# Patient Record
Sex: Female | Born: 1935 | Race: Black or African American | Hispanic: No | Smoking: Former smoker
Health system: Southern US, Community
[De-identification: ages and names within clinical notes are randomized; demographics above are authoritative.]

## PROBLEM LIST (undated history)

## (undated) DIAGNOSIS — Z8719 Personal history of other diseases of the digestive system: Secondary | ICD-10-CM

## (undated) DIAGNOSIS — M1990 Unspecified osteoarthritis, unspecified site: Secondary | ICD-10-CM

## (undated) DIAGNOSIS — T7840XA Allergy, unspecified, initial encounter: Secondary | ICD-10-CM

## (undated) DIAGNOSIS — E785 Hyperlipidemia, unspecified: Secondary | ICD-10-CM

## (undated) DIAGNOSIS — I10 Essential (primary) hypertension: Secondary | ICD-10-CM

## (undated) DIAGNOSIS — J45909 Unspecified asthma, uncomplicated: Secondary | ICD-10-CM

## (undated) DIAGNOSIS — E079 Disorder of thyroid, unspecified: Secondary | ICD-10-CM

## (undated) DIAGNOSIS — Z8711 Personal history of peptic ulcer disease: Secondary | ICD-10-CM

## (undated) DIAGNOSIS — H409 Unspecified glaucoma: Secondary | ICD-10-CM

## (undated) HISTORY — DX: Personal history of peptic ulcer disease: Z87.11

## (undated) HISTORY — PX: KNEE SURGERY: SHX244

## (undated) HISTORY — PX: ABDOMINAL HYSTERECTOMY: SHX81

## (undated) HISTORY — DX: Personal history of other diseases of the digestive system: Z87.19

## (undated) HISTORY — PX: INCONTINENCE SURGERY: SHX676

## (undated) HISTORY — DX: Essential (primary) hypertension: I10

## (undated) HISTORY — PX: FOOT SURGERY: SHX648

## (undated) HISTORY — DX: Unspecified glaucoma: H40.9

## (undated) HISTORY — DX: Unspecified osteoarthritis, unspecified site: M19.90

## (undated) HISTORY — DX: Allergy, unspecified, initial encounter: T78.40XA

## (undated) HISTORY — PX: HIP SURGERY: SHX245

## (undated) HISTORY — PX: SPINE SURGERY: SHX786

## (undated) HISTORY — DX: Hyperlipidemia, unspecified: E78.5

## (undated) HISTORY — DX: Disorder of thyroid, unspecified: E07.9

## (undated) HISTORY — DX: Unspecified asthma, uncomplicated: J45.909

## (undated) HISTORY — PX: FEMUR FRACTURE SURGERY: SHX633

## (undated) HISTORY — PX: NECK SURGERY: SHX720

---

## 1997-07-07 ENCOUNTER — Encounter: Admission: RE | Admit: 1997-07-07 | Discharge: 1997-10-05 | Payer: Self-pay | Admitting: Neurological Surgery

## 1997-11-07 ENCOUNTER — Other Ambulatory Visit: Admission: RE | Admit: 1997-11-07 | Discharge: 1997-11-07 | Payer: Self-pay | Admitting: Family Medicine

## 1998-11-25 ENCOUNTER — Encounter: Payer: Self-pay | Admitting: Orthopedic Surgery

## 1998-11-25 ENCOUNTER — Encounter: Admission: RE | Admit: 1998-11-25 | Discharge: 1998-11-25 | Payer: Self-pay | Admitting: Orthopedic Surgery

## 1999-05-07 ENCOUNTER — Encounter: Admission: RE | Admit: 1999-05-07 | Discharge: 1999-05-07 | Payer: Self-pay | Admitting: Rheumatology

## 1999-05-07 ENCOUNTER — Encounter: Payer: Self-pay | Admitting: Rheumatology

## 2000-08-09 ENCOUNTER — Encounter: Payer: Self-pay | Admitting: Rheumatology

## 2000-08-09 ENCOUNTER — Encounter: Admission: RE | Admit: 2000-08-09 | Discharge: 2000-08-09 | Payer: Self-pay | Admitting: Rheumatology

## 2000-08-30 ENCOUNTER — Encounter: Payer: Self-pay | Admitting: Neurological Surgery

## 2000-08-30 ENCOUNTER — Encounter: Admission: RE | Admit: 2000-08-30 | Discharge: 2000-08-30 | Payer: Self-pay | Admitting: Neurological Surgery

## 2000-09-19 ENCOUNTER — Encounter: Admission: RE | Admit: 2000-09-19 | Discharge: 2000-09-19 | Payer: Self-pay | Admitting: Neurological Surgery

## 2000-09-19 ENCOUNTER — Encounter: Payer: Self-pay | Admitting: Neurological Surgery

## 2000-11-10 ENCOUNTER — Other Ambulatory Visit: Admission: RE | Admit: 2000-11-10 | Discharge: 2000-11-10 | Payer: Self-pay | Admitting: Obstetrics and Gynecology

## 2001-08-30 ENCOUNTER — Ambulatory Visit (HOSPITAL_COMMUNITY): Admission: RE | Admit: 2001-08-30 | Discharge: 2001-08-30 | Payer: Self-pay | Admitting: Internal Medicine

## 2001-08-30 ENCOUNTER — Encounter: Payer: Self-pay | Admitting: Internal Medicine

## 2001-09-04 ENCOUNTER — Encounter: Payer: Self-pay | Admitting: Internal Medicine

## 2001-09-04 ENCOUNTER — Encounter: Admission: RE | Admit: 2001-09-04 | Discharge: 2001-09-04 | Payer: Self-pay | Admitting: Internal Medicine

## 2002-01-25 ENCOUNTER — Encounter: Admission: RE | Admit: 2002-01-25 | Discharge: 2002-01-25 | Payer: Self-pay | Admitting: Orthopedic Surgery

## 2002-01-25 ENCOUNTER — Encounter: Payer: Self-pay | Admitting: Orthopedic Surgery

## 2002-02-15 ENCOUNTER — Encounter: Admission: RE | Admit: 2002-02-15 | Discharge: 2002-03-14 | Payer: Self-pay | Admitting: Neurological Surgery

## 2002-03-21 ENCOUNTER — Encounter: Admission: RE | Admit: 2002-03-21 | Discharge: 2002-03-21 | Payer: Self-pay | Admitting: Internal Medicine

## 2002-03-21 ENCOUNTER — Encounter: Payer: Self-pay | Admitting: Internal Medicine

## 2002-04-01 ENCOUNTER — Encounter (HOSPITAL_COMMUNITY): Admission: RE | Admit: 2002-04-01 | Discharge: 2002-06-30 | Payer: Self-pay | Admitting: Internal Medicine

## 2002-04-02 ENCOUNTER — Encounter: Payer: Self-pay | Admitting: Internal Medicine

## 2002-05-10 ENCOUNTER — Encounter: Payer: Self-pay | Admitting: Internal Medicine

## 2002-11-20 ENCOUNTER — Encounter: Admission: RE | Admit: 2002-11-20 | Discharge: 2002-11-20 | Payer: Self-pay | Admitting: Orthopedic Surgery

## 2003-03-11 ENCOUNTER — Encounter: Admission: RE | Admit: 2003-03-11 | Discharge: 2003-03-11 | Payer: Self-pay | Admitting: Internal Medicine

## 2003-04-08 ENCOUNTER — Encounter: Admission: RE | Admit: 2003-04-08 | Discharge: 2003-04-08 | Payer: Self-pay | Admitting: Neurological Surgery

## 2003-04-28 ENCOUNTER — Encounter: Admission: RE | Admit: 2003-04-28 | Discharge: 2003-07-27 | Payer: Self-pay | Admitting: Internal Medicine

## 2003-08-03 ENCOUNTER — Ambulatory Visit (HOSPITAL_COMMUNITY): Admission: RE | Admit: 2003-08-03 | Discharge: 2003-08-03 | Payer: Self-pay | Admitting: Neurological Surgery

## 2003-08-21 ENCOUNTER — Encounter: Admission: RE | Admit: 2003-08-21 | Discharge: 2003-08-21 | Payer: Self-pay | Admitting: Neurological Surgery

## 2003-09-18 ENCOUNTER — Encounter: Admission: RE | Admit: 2003-09-18 | Discharge: 2003-09-18 | Payer: Self-pay | Admitting: Internal Medicine

## 2004-01-21 ENCOUNTER — Ambulatory Visit (HOSPITAL_BASED_OUTPATIENT_CLINIC_OR_DEPARTMENT_OTHER): Admission: RE | Admit: 2004-01-21 | Discharge: 2004-01-21 | Payer: Self-pay | Admitting: Orthopedic Surgery

## 2004-01-21 ENCOUNTER — Ambulatory Visit (HOSPITAL_COMMUNITY): Admission: RE | Admit: 2004-01-21 | Discharge: 2004-01-21 | Payer: Self-pay | Admitting: Orthopedic Surgery

## 2004-05-25 ENCOUNTER — Encounter: Admission: RE | Admit: 2004-05-25 | Discharge: 2004-05-25 | Payer: Self-pay | Admitting: Internal Medicine

## 2004-09-03 ENCOUNTER — Inpatient Hospital Stay (HOSPITAL_COMMUNITY)
Admission: RE | Admit: 2004-09-03 | Discharge: 2004-09-14 | Payer: Self-pay | Admitting: Physical Medicine & Rehabilitation

## 2004-09-03 ENCOUNTER — Ambulatory Visit: Payer: Self-pay | Admitting: Physical Medicine & Rehabilitation

## 2004-10-12 ENCOUNTER — Ambulatory Visit (HOSPITAL_COMMUNITY): Admission: RE | Admit: 2004-10-12 | Discharge: 2004-10-12 | Payer: Self-pay | Admitting: Obstetrics and Gynecology

## 2005-07-21 ENCOUNTER — Encounter: Admission: RE | Admit: 2005-07-21 | Discharge: 2005-07-21 | Payer: Self-pay | Admitting: Internal Medicine

## 2006-03-30 ENCOUNTER — Ambulatory Visit (HOSPITAL_COMMUNITY): Admission: RE | Admit: 2006-03-30 | Discharge: 2006-03-31 | Payer: Self-pay | Admitting: Orthopedic Surgery

## 2006-06-09 ENCOUNTER — Ambulatory Visit (HOSPITAL_BASED_OUTPATIENT_CLINIC_OR_DEPARTMENT_OTHER)
Admission: RE | Admit: 2006-06-09 | Discharge: 2006-06-09 | Payer: Self-pay | Admitting: Physical Medicine and Rehabilitation

## 2006-09-14 ENCOUNTER — Encounter: Admission: RE | Admit: 2006-09-14 | Discharge: 2006-09-14 | Payer: Self-pay | Admitting: Internal Medicine

## 2007-10-11 ENCOUNTER — Encounter: Admission: RE | Admit: 2007-10-11 | Discharge: 2007-10-11 | Payer: Self-pay | Admitting: Internal Medicine

## 2008-08-01 ENCOUNTER — Encounter: Admission: RE | Admit: 2008-08-01 | Discharge: 2008-08-01 | Payer: Self-pay | Admitting: Neurological Surgery

## 2008-10-28 ENCOUNTER — Encounter: Admission: RE | Admit: 2008-10-28 | Discharge: 2008-10-28 | Payer: Self-pay | Admitting: Family Medicine

## 2009-02-25 ENCOUNTER — Inpatient Hospital Stay (HOSPITAL_COMMUNITY): Admission: RE | Admit: 2009-02-25 | Discharge: 2009-02-28 | Payer: Self-pay | Admitting: Orthopedic Surgery

## 2009-04-04 ENCOUNTER — Inpatient Hospital Stay (HOSPITAL_COMMUNITY): Admission: EM | Admit: 2009-04-04 | Discharge: 2009-04-14 | Payer: Self-pay | Admitting: Emergency Medicine

## 2009-11-12 ENCOUNTER — Encounter: Admission: RE | Admit: 2009-11-12 | Discharge: 2009-11-12 | Payer: Self-pay | Admitting: Family Medicine

## 2010-02-06 ENCOUNTER — Encounter: Payer: Self-pay | Admitting: Family Medicine

## 2010-04-07 LAB — CBC
Hemoglobin: 12.9 g/dL (ref 12.0–15.0)
MCV: 94.7 fL (ref 78.0–100.0)
WBC: 5.4 10*3/uL (ref 4.0–10.5)

## 2010-04-07 LAB — BASIC METABOLIC PANEL
CO2: 30 mEq/L (ref 19–32)
Calcium: 9.3 mg/dL (ref 8.4–10.5)
Creatinine, Ser: 0.63 mg/dL (ref 0.4–1.2)
GFR calc non Af Amer: >60 mL/min (ref >60)
Glucose, Bld: 94 mg/dL (ref 70–99)

## 2010-04-07 LAB — HEMOGLOBIN AND HEMATOCRIT, BLOOD
HCT: 29 % — ABNORMAL LOW (ref 36.0–46.0)
HCT: 29.1 % — ABNORMAL LOW (ref 36.0–46.0)
HCT: 30.4 % — ABNORMAL LOW (ref 36.0–46.0)
Hemoglobin: 10 g/dL — ABNORMAL LOW (ref 12.0–15.0)
Hemoglobin: 10.2 g/dL — ABNORMAL LOW (ref 12.0–15.0)
Hemoglobin: 9.9 g/dL — ABNORMAL LOW (ref 12.0–15.0)

## 2010-04-07 LAB — TYPE AND SCREEN
ABO/RH(D): A POS
Antibody Screen: NEGATIVE

## 2010-04-07 LAB — ABO/RH: ABO/RH(D): A POS

## 2010-04-11 LAB — CBC
HCT: 19.7 % — ABNORMAL LOW (ref 36.0–46.0)
HCT: 24.1 % — ABNORMAL LOW (ref 36.0–46.0)
HCT: 29.3 % — ABNORMAL LOW (ref 36.0–46.0)
HCT: 35.7 % — ABNORMAL LOW (ref 36.0–46.0)
Hemoglobin: 8.3 g/dL — ABNORMAL LOW (ref 12.0–15.0)
Hemoglobin: 9.3 g/dL — ABNORMAL LOW (ref 12.0–15.0)
Hemoglobin: 9.3 g/dL — ABNORMAL LOW (ref 12.0–15.0)
Hemoglobin: 9.8 g/dL — ABNORMAL LOW (ref 12.0–15.0)
MCHC: 33.4 g/dL (ref 30.0–36.0)
MCHC: 33.5 g/dL (ref 30.0–36.0)
MCHC: 34.4 g/dL (ref 30.0–36.0)
MCV: 88.8 fL (ref 78.0–100.0)
MCV: 89.1 fL (ref 78.0–100.0)
MCV: 89.7 fL (ref 78.0–100.0)
MCV: 93.9 fL (ref 78.0–100.0)
MCV: 94.6 fL (ref 78.0–100.0)
Platelets: 184 10*3/uL (ref 150–400)
RBC: 2.1 MIL/uL — ABNORMAL LOW (ref 3.87–5.11)
RBC: 2.72 MIL/uL — ABNORMAL LOW (ref 3.87–5.11)
RBC: 2.92 MIL/uL — ABNORMAL LOW (ref 3.87–5.11)
RBC: 3.08 MIL/uL — ABNORMAL LOW (ref 3.87–5.11)
RBC: 3.11 MIL/uL — ABNORMAL LOW (ref 3.87–5.11)
RBC: 3.23 MIL/uL — ABNORMAL LOW (ref 3.87–5.11)
RBC: 3.78 MIL/uL — ABNORMAL LOW (ref 3.87–5.11)
RDW: 13.6 % (ref 11.5–15.5)
RDW: 16.4 % — ABNORMAL HIGH (ref 11.5–15.5)
WBC: 10.5 10*3/uL (ref 4.0–10.5)
WBC: 7.6 10*3/uL (ref 4.0–10.5)
WBC: 9.5 10*3/uL (ref 4.0–10.5)

## 2010-04-11 LAB — BASIC METABOLIC PANEL
BUN: 3 mg/dL — ABNORMAL LOW (ref 6–23)
CO2: 23 mEq/L (ref 19–32)
CO2: 26 mEq/L (ref 19–32)
Calcium: 8.2 mg/dL — ABNORMAL LOW (ref 8.4–10.5)
Chloride: 103 mEq/L (ref 96–112)
Chloride: 104 mEq/L (ref 96–112)
Chloride: 107 mEq/L (ref 96–112)
Creatinine, Ser: 0.52 mg/dL (ref 0.4–1.2)
Creatinine, Ser: 0.56 mg/dL (ref 0.4–1.2)
GFR calc Af Amer: >60 mL/min (ref >60)
GFR calc Af Amer: >60 mL/min (ref >60)
GFR calc Af Amer: >60 mL/min (ref >60)
GFR calc Af Amer: >60 mL/min (ref >60)
GFR calc Af Amer: >60 mL/min (ref >60)
GFR calc non Af Amer: >60 mL/min (ref >60)
GFR calc non Af Amer: >60 mL/min (ref >60)
Glucose, Bld: 153 mg/dL — ABNORMAL HIGH (ref 70–99)
Potassium: 3 mEq/L — ABNORMAL LOW (ref 3.5–5.1)
Potassium: 3.6 mEq/L (ref 3.5–5.1)
Potassium: 3.6 mEq/L (ref 3.5–5.1)
Sodium: 135 mEq/L (ref 135–145)
Sodium: 135 mEq/L (ref 135–145)
Sodium: 137 mEq/L (ref 135–145)
Sodium: 139 mEq/L (ref 135–145)

## 2010-04-11 LAB — URINE CULTURE
Colony Count: NO GROWTH
Culture: NO GROWTH
Special Requests: NEGATIVE

## 2010-04-11 LAB — TYPE AND SCREEN: ABO/RH(D): A POS

## 2010-04-11 LAB — COMPREHENSIVE METABOLIC PANEL
ALT: 14 U/L (ref 0–35)
BUN: 10 mg/dL (ref 6–23)
CO2: 25 mEq/L (ref 19–32)
Calcium: 9 mg/dL (ref 8.4–10.5)
Chloride: 101 mEq/L (ref 96–112)
Creatinine, Ser: 0.76 mg/dL (ref 0.4–1.2)
Glucose, Bld: 142 mg/dL — ABNORMAL HIGH (ref 70–99)
Total Bilirubin: 0.4 mg/dL (ref 0.3–1.2)
Total Protein: 7.2 g/dL (ref 6.0–8.3)

## 2010-04-11 LAB — URINALYSIS, ROUTINE W REFLEX MICROSCOPIC
Bilirubin Urine: NEGATIVE
Glucose, UA: NEGATIVE mg/dL
Glucose, UA: NEGATIVE mg/dL
Hgb urine dipstick: NEGATIVE
Ketones, ur: 15 mg/dL — AB
Ketones, ur: 15 mg/dL — AB
Ketones, ur: NEGATIVE mg/dL
Leukocytes, UA: NEGATIVE
Nitrite: NEGATIVE
Nitrite: NEGATIVE
Nitrite: POSITIVE — AB
Protein, ur: NEGATIVE mg/dL
Specific Gravity, Urine: 1.012 (ref 1.005–1.030)
Specific Gravity, Urine: 1.018 (ref 1.005–1.030)
pH: 6 (ref 5.0–8.0)
pH: 6 (ref 5.0–8.0)

## 2010-04-11 LAB — URINE MICROSCOPIC-ADD ON

## 2010-04-11 LAB — PROTIME-INR
INR: 1.28 (ref 0.00–1.49)
INR: 2.1 — ABNORMAL HIGH (ref 0.00–1.49)
INR: 2.6 — ABNORMAL HIGH (ref 0.00–1.49)
INR: 2.73 — ABNORMAL HIGH (ref 0.00–1.49)
Prothrombin Time: 18.4 seconds — ABNORMAL HIGH (ref 11.6–15.2)
Prothrombin Time: 20.2 seconds — ABNORMAL HIGH (ref 11.6–15.2)
Prothrombin Time: 23.4 seconds — ABNORMAL HIGH (ref 11.6–15.2)
Prothrombin Time: 27.1 seconds — ABNORMAL HIGH (ref 11.6–15.2)
Prothrombin Time: 27.6 seconds — ABNORMAL HIGH (ref 11.6–15.2)
Prothrombin Time: 28.7 seconds — ABNORMAL HIGH (ref 11.6–15.2)

## 2010-04-11 LAB — DIFFERENTIAL
Basophils Absolute: 0 10*3/uL (ref 0.0–0.1)
Basophils Relative: 0 % (ref 0–1)
Lymphs Abs: 1.1 10*3/uL (ref 0.7–4.0)
Monocytes Absolute: 0.6 10*3/uL (ref 0.1–1.0)

## 2010-04-11 LAB — URINALYSIS, MICROSCOPIC ONLY
Ketones, ur: NEGATIVE mg/dL
Leukocytes, UA: NEGATIVE
Nitrite: NEGATIVE
Urobilinogen, UA: 0.2 mg/dL (ref 0.0–1.0)
pH: 5.5 (ref 5.0–8.0)

## 2010-06-01 NOTE — Op Note (Signed)
Katherine Espinoza, Katherine Espinoza           ACCOUNT NO.:  [id number]   MEDICAL RECORD NO.:  [id number]          PATIENT TYPE:  AMB   LOCATION:  DSC                          FACILITY:  [facility]   PHYSICIAN:  Richard D. Ramos, M.D. DATE OF BIRTH:  04-Mar-1934   DATE OF PROCEDURE:  06/09/2006  DATE OF DISCHARGE:                               OPERATIVE REPORT   INDICATIONS:  The patient is a 77 year old female who has had chronic  left greater than right lower limb pain.  She has  known  spondylolisthesis.  However, she has been evaluated by spine surgery and  they have not recommended any surgery.[name]   PROCEDURE PERFORMED:  Due to the patient's persistent chronic lower limb  pain, she was sent today for a spinal cord stimulator trial.   PREOPERATIVE DIAGNOSES:  1. Chronic left greater than right lower limb pain.  2. Lumbar degenerative disk disease.  3. Spondylolisthesis.   POSTOPERATIVE DIAGNOSES:  1. Chronic left greater than right lower limb pain.  2. Lumbar degenerative disk disease.  3. Spondylolisthesis.   ANESTHESIA:  Monitored anesthesia care.   OPERATIVE SUMMARY:  After an informed consent was signed, the patient  was brought back to the operating room.  She was cleansed with Betadine  scrub x3.  She was then sterilely draped.  With the 17-guage Tuohy  spinal needle, I placed this into the L1-2 interspace using AP  fluoroscopic imaging.  The epidural space was localized using the loss  of resistance technique.  No CSF aspirate was noted.  I then placed the  ANS Octrode trial lead, model #3086, lot [id number], into the epidural  space.  Using intermittent fluoroscopic visualization, I guided the lead  to the superior aspect of the T9 vertebral body.  This was then checked  under AP and lateral fluoroscopic imaging to be in the appropriate  position in the epidural space.  The lead was then connected to the  generator with the ANS representative present.  We were able to get  coverage in  both lower extremities from the buttock all the way into the  foot.  I then removed the introducer needle, as well as the stylet using  intermittent fluoroscopic imaging to make sure I did not move the  stimulator lead.  The lead was then sutured to the skin using 2-0  Prolene.  I did this 3 times.  A Tegaderm dressing was applied.   The patient was brought back to the recovery room in stable condition.  I will see her back in 5 days to remove the trial lead.      Richard D. Ethelene Hal, M.D.  Electronically Signed     RDR/MEDQ  D:  06/09/2006  T:  06/09/2006  Job:  [id number]

## 2010-06-04 NOTE — H&P (Signed)
NAMELADAN, VANDERZANDEN NO.:  [id number]   MEDICAL RECORD NO.:  [id number]          PATIENT TYPE:  IPS   LOCATION:  4031                         FACILITY:  [facility]   PHYSICIAN:  Ranelle Oyster, M.D.DATE OF BIRTH:  09/02/1934   DATE OF ADMISSION:  09/03/2004  DATE OF DISCHARGE:                                HISTORY & PHYSICAL   CHIEF COMPLAINT:  Left hip pain.   HISTORY OF PRESENT ILLNESS:  This is a pleasant 76 year old black female who  is a resident of [place] who was vacationing in [place] when she  fell walking the ocean.  She sustained a left subtrochanteric femur fracture  and underwent a closed reduction and IM rodding on August 15.  The patient's  course was complicated somewhat by elevated blood sugars and mild  postoperative anemia.  She was placed on subcu Lovenox for DVT prophylaxis  at 40 mg daily.  Her pain was controlled with OxyContin 20 mg q12.h and  oxycodone p.r.n.  The patient's has been moving with therapy and is min to  mod assist for basic transfers.   REVIEW OF SYSTEMS:  The patient reports reflux, cough, shortness of breath,  constipation.  Full review of systems is in the written H&P.   PAST MEDICAL HISTORY:  Positive for hypertension, asthma, chronic lower  extremity discomfort with history of neurogenic claudication and nerve  blocks.  Apparently, she was seen by Dr. Barnett Abu.  She also had right  great toe metatarsophalangeal fusion in January 2006 performed by Dr.  Lestine Box.  History is also significant for osteoporosis, bladder tack,  hypothyroidism, right knee arthroscopy, C spine surgery.  History is  negative for alcohol and remote for tobacco use.   FAMILY HISTORY:  Noncontributory.   SOCIAL HISTORY:  The patient lives alone in [place] and has senior  citizen apartment high rise.  Local daughter can help her as needed.  The  patient was driving and using a cane prior to admission.   Medications prior to  arrival include Fosamax, Os-Cal, lisinopril, Norvasc,  Lopressor, Advair, Synthroid.   ALLERGIES:  IVP DYE.   LABORATORY DATA:  Hemoglobin 10.3, white count 11.9, platelets 208,000,  sodium 142, potassium 3.7, BUN and creatinine 13 and 1.0.   PHYSICAL EXAMINATION:  GENERAL:  The patient is pleasant in no acute  distress.  The patient is obese, but pleasant in no acute distress.  VITAL SIGNS:  Blood pressure 132/74, pulse 82, respiratory rate 16,  temperature 98.4.  HEENT:  Pupils are equal, round, reactive to light and accommodation.  Extraocular eye movements are intact.  Ear, nose, and throat exam is  unremarkable with only slight white film over the top of the tongue.  NECK:  Supple with jugular venous distention or lymphadenopathy.  CHEST:  Clear to auscultation without wheezes, rales, or rhonchi.  HEART:  Regular rate and rhythm without murmurs, rubs or gallops.  ABDOMEN:  Soft, nontender.  Bowel sounds are positive.  EXTREMITIES:  No clubbing, cyanosis.  Trace to 1+ edema bilaterally, left  slightly greater than right.  On examination of the left hip there was  minimal serosanguineous discharge particularly from the proximal incision.  Both areas were well approximated and areas were nonerythematous nor warm.  There were two small tape blisters over the top of the quadriceps region.  NEUROLOGIC:  The patient's cranial nerves II-XII are intact.  Reflexes are  1+ to 2+.  Sensation is grossly intact.  Judgment, orientation, memory,  affect were all appropriate.  The motor strength in the upper extremities  was 4+ to 5/5.  Right lower extremity strength was 3+ to 4 proximally, 4/5  distally.  Left lower extremity was 2 to 2+ out of 5 proximally and 3+/5  distally.  Left hip was appropriately tender.   ASSESSMENT/PLAN:  1.  Functional deficit secondary to subtrochanteric femur fracture status      post closed reduction and IM rodding on August 15.  Be in comprehensive       inpatient rehab with modified independent to supervision goals.      Estimated length of stay is seven days.  Prognosis is good.  2.  Pain management with OxyContin controlled release 20 mg q.12h, oxycodone      IR 5 mg q.6h p.r.n. and Skelaxin 800 mg q.6h p.r.n.  3.  Deep vein thrombosis prophylaxis with subcutaneous Lovenox 30 mg q.12h.  4.  Postop anemia.  Followup admission labs and supplement with iron.  5.  Asthma management.  Continue to monitor while on Advair and hand-held      nebs.  6.  New onset diabetes. Check CBGs.  Modify diet as needed.  7.  Hypothyroidism.  Synthroid.  8.  Hypertension.  Lisinopril, Lopressor.      Ranelle Oyster, M.D.  Electronically Signed     ZTS/MEDQ  D:  09/03/2004  T:  09/03/2004  Job:  [id number]

## 2010-06-04 NOTE — Op Note (Signed)
NAMETIANNE, PLOTT           ACCOUNT NO.:  [id number]   MEDICAL RECORD NO.:  [id number]          PATIENT TYPE:  AMB   LOCATION:  DSC                          FACILITY:  [facility]   PHYSICIAN:  Leonides Grills, M.D.     DATE OF BIRTH:  01-07-1935   DATE OF PROCEDURE:  01/21/2004  DATE OF DISCHARGE:                                 OPERATIVE REPORT   PREOPERATIVE DIAGNOSIS:  Right great toe metatarsophalangeal joint  arthritis, i.e. hallus rigidus.   POSTOPERATIVE DIAGNOSIS:  Right great toe metatarsophalangeal joint  arthritis, i.e. hallus rigidus.   OPERATION:  1.  Right great toe metatarsophalangeal joint fusion.  2.  Right local bone graft.  3.  Stress x-rays, right foot.   SURGEON:  Leonides Grills, M.D.   ASSISTANT:  Lianne Cure, P.A.   ANESTHESIA:  General endotracheal tube.   ESTIMATED BLOOD LOSS:  Minimal.   TOURNIQUET TIME:  Approximately 40 minutes.   COMPLICATIONS:  None.   DISPOSITION:  Stable to the PR.   INDICATIONS:  This is a 76 year old female who has had long-standing right  great toe pain from arthritis that was interfering with her life to the  point that she cannot do what she wants to do despite conservative  management.  She was consented for the above procedure.  The risks which  included infection, nerve or vessel injury, nonunion, malunion, tissue or  hardware failure, persistent pain, worsening stiffness and arthritis of  neighboring joint, prolonged recovery were all explained, questions were  encouraged and answered.   DESCRIPTION OF PROCEDURE:  The patient was brought to the operating room and  placed in the supine position.  After adequate general endotracheal tube  anesthesia was administered with a popliteal block as well as Ancef 1 gram  IV piggyback, the right lower extremity was then prepped and draped in a  sterile manner.  With a proximal placed thigh tourniquet, the limb was  gravity exsanguinated and the tourniquet was elevated  to 290 mmHg.  A  longitudinal incision on the medial aspect of the right great toe MTP joint  was then made and dissection was carried down to bone.  The capsule was then  elevated both superiorly and inferiorly.  The joint was then entered, there  was a tremendous amount of arthritis in this area.  Remaining cartilage was  removed with a curved 1/4-inch osteotome and synovectomy rongeur.  Local  bone graft was obtained from spurs in this area as well and placed on the  back table for later bone graft.  Local bone graft was also obtained from  drill holes later on the in the procedure as well.  Multiple 2-mm drill  holes were placed on either side of the joint.  A 2-mm wide K-wire was then  placed after the toe was held in a reduced position which was just slightly  extended from a parallel surface and was placed on the plantar aspect of the  foot.  X-rays were obtained to verify this position to be in excellent  position in the AP and lateral planes.  Stress x-rays were obtained as well  and showed that there was no gross motion over this area as well and that  the IP joint was stiff in extension and it only extended to 0 but flexed to  about 20 to 25 degrees.  We then placed a 3.5-mm fully-threaded cortical  screw from the base of the proximal phalanx into the first metatarsal head  using 3.5 and 2.5-mm drill holes, respectively, creating a lag screw.  This  had excellent compression.  The K-wire was then removed and another 3.5-mm  fully-threaded cortical lag screw, using 3.5 and 2.5-mm drill holes,  respectively was then placed.  This was from the metatarsal head medially  into the base of the proximal phalanx laterally.  Again, this had excellent  compression and maintenance of the correction.  X-rays were obtained in AP  and lateral planes as well as with a ___________ on the plantar aspect of  the foot showed that this was slightly extended from the parallel axis of  the plantar  aspect of the foot and we did this intentionally due to the  stiffness and extension of the IP joint.  The area was copiously irrigated  with normal saline, the tourniquet was deflated, hemostasis was obtained, a  bur was used to create stress strain-relieving bone graft sites within the  MTP joint and local bone graft that was obtained throughout the procedure  was then packed into these areas.  This had an excellent repair.  The  capsule was closed with 3-0 Vicryl and the skin was closed with 4-0 nylon.  A sterile dressing was applied, a modified Jones dressing was applied, and  the patient was stable to PR.      Paul   PB/MEDQ  D:  01/21/2004  T:  01/21/2004  Job:  [id number]

## 2010-06-04 NOTE — Discharge Summary (Signed)
NAMEMYESHA, STILLION NO.:  [id number]   MEDICAL RECORD NO.:  [id number]          PATIENT TYPE:  IPS   LOCATION:  4031                         FACILITY:  [facility]   PHYSICIAN:  Erick Colace, M.D.DATE OF BIRTH:  [birth year]   DATE OF ADMISSION:  09/03/2004  DATE OF DISCHARGE:  09/14/2004                                 DISCHARGE SUMMARY   ADDENDUM:  Patient was discharged in stable condition August 29.  Initial indications  upon transfer from [facility] with  hemoglobin A1C of 6.1.  She had been placed on Glucophage while at [facility].  During her stay at [facility] blood sugars of 79, 89, 111.  Her Glucophage was discontinued day of discharge August 29.  She was advised  to follow up with her primary M.D., Madison Hickman for ongoing monitoring.  All issues were discussed with family.      Mariam Dollar, P.A.      Erick Colace, M.D.  Electronically Signed    DA/MEDQ  D:  09/14/2004  T:  09/14/2004  Job:  [id number]   cc:   Darius Bump, M.D.  [place] [address]  [place] [address]  Fax: [phone]

## 2010-06-04 NOTE — Discharge Summary (Signed)
NAMECARRIE, USERY NO.:  [id number]   MEDICAL RECORD NO.:  [id number]          PATIENT TYPE:  IPS   LOCATION:  4031                         FACILITY:  [facility]   PHYSICIAN:  Erick Colace, M.D.DATE OF BIRTH:  Aug 22, 1934   DATE OF ADMISSION:  09/03/2004  DATE OF DISCHARGE:  09/14/2004                                 DISCHARGE SUMMARY   DISCHARGE DIAGNOSES:  1.  Left subtrochanteric femur fracture status post closed reduction      intramedullary rodding August 31, 2004.  2.  Pain management.  3.  Subcutaneous Lovenox for deep venous thrombosis prophylaxis.  4.  Postoperative anemia.  5.  Asthma.  6.  New onset diabetes mellitus.  7.  Hypothyroidism.  8.  Hypertension.   A 76 year old female resident of [facility] admitted [facility] August 14 after a fall while [place].  No loss  of consciousness.  Sustained a left subtrochanteric femur fracture.  Underwent closed reduction intramedullary rod placement August 15 per Dr.  Sherrine Maples [phone]).  Postoperative anemia 9.5 and monitored.  Hospital  course noted elevated hemoglobin A1C per report.  Placed on oral agents  while at [facility].  Subcutaneous Lovenox for deep venous thrombosis  prophylaxis.  Admitted for a comprehensive rehabilitation program.   PAST MEDICAL HISTORY:  See discharge diagnoses.  No alcohol.  Remote smoker.   SOCIAL HISTORY:  Lives alone in [place] senior citizen high rise.  Local  daughter check as needed.   MEDICATIONS PRIOR TO ADMISSION:  1.  Fosamax weekly.  2.  Os-Cal.  3.  Lisinopril 20 mg daily.  4.  Norvasc 5 mg daily.  5.  Lopressor 25 mg twice daily.  6.  Advair daily.  7.  Synthroid daily.   No other doses were made available.   HOSPITAL COURSE:  Patient with progressive gains while on rehabilitation  services with therapies initiated on a b.i.d. basis.  The following issues  were followed during patient's  rehabilitation course.  Pertaining to Ms.  Andreoni's left subtrochanteric femur fracture, she had undergone closed  reduction with IM rod placement August 15 while at [facility] per  Dr. Sherrine Maples at [phone].  She was weightbearing as tolerated.  Staples had  been removed.  Neurovascular sensation intact.  She would follow up with  local orthopedic services at the discretion of her primary M.D., Dr. Madison Hickman.  She was tapered from her OxyContin sustained release.  She remained  on oxycodone for breakthrough pain.  Subcutaneous Lovenox for deep venous  thrombosis prophylaxis.  Her calves remained cool without any swelling,  erythema, nontender.  Postoperative anemia with latest hemoglobin of 9.1,  hematocrit 26.4 that showed a generous improvement from 8.6.  She remained  on her Trinsicon twice daily.  Noted history of asthma.  Her oxygen  saturations remained greater than 90%.  She continued on her inhalers as  prior to admission.  Hormone supplement for her  hypothyroidism.  New onset  diabetes mellitus with hemoglobin A1C of 6.1.  She was placed on low doses  of Glucophage 500 mg  daily with blood sugars 116, 111, 115.  She did receive  her necessary diabetic teaching and would follow up with her primary M.D.  She had no bowel or bladder disturbances throughout her rehabilitation  course.  Overall functionally she continued to progress in all areas of  mobility.  She was ambulating supervision level with a rolling walker 75  feet, supervision minimal assist activities of daily living needing  assistance for lower body dressing and bathing.  She was discharged to home  with home health therapies.   DISCHARGE MEDICATIONS:  1.  Os-Cal 500 mg twice daily.  2.  Trinsicon twice daily.  3.  Multivitamin daily.  4.  Synthroid 50 mcg daily.  5.  Lisinopril 20 mg daily.  6.  Glucophage 500 mg daily.  7.  Lopressor 50 mg twice daily.  8.  Advair 100/50 Diskus at bedtime.   9.  Lovastatin 5 mg daily.  10. Albuterol inhaler as needed.  11. Prilosec 20 mg daily.  12. She was tapered from OxyContin CR of 20 mg every 12 hours to 10 mg every      12 hours x1 week and discontinued.   She was weightbearing as tolerated.  She would follow up with Dr. Madison Hickman medical management and preference of orthopedic services in  [place].      Mariam Dollar, P.A.      Erick Colace, M.D.  Electronically Signed    DA/MEDQ  D:  09/13/2004  T:  09/13/2004  Job:  [id number]   cc:   Darius Bump, M.D.  [place]  [place] [address]  Fax: [phone]

## 2010-06-04 NOTE — Op Note (Signed)
Katherine Espinoza, Katherine Espinoza           ACCOUNT NO.:  [id number]   MEDICAL RECORD NO.:  [id number]          PATIENT TYPE:  OIB   LOCATION:  1505                         FACILITY:  [facility]   PHYSICIAN:  Madlyn Frankel. Charlann Boxer, M.D.  DATE OF BIRTH:  1934-03-06   DATE OF PROCEDURE:  03/30/2006  DATE OF DISCHARGE:  03/31/2006                               OPERATIVE REPORT   PREOPERATIVE DIAGNOSES:  Painful retained hardware. left hip, with  history of intertrochanteric femur fracture, status post intramedullary  nailing.   POSTOPERATIVE DIAGNOSES:  Painful retained hardware, left hip, with  history of intertrochanteric femur fracture, status post intramedullary  nailing.   PROCEDURE:  Removal of painful left hardware, left hip, deep implant,  including lag screw and long nail from [employer].   SURGEON:  Madlyn Frankel. Charlann Boxer, M.D.   ASSISTANT:  Dwyane Luo, PA-C.   ANESTHESIA:  General anesthesia.   BLOOD LOSS:  Minimal.   COMPLICATIONS:  None.   INDICATIONS:  Ms. Hoopes is a 76 year old female who presented to  the office after having her hip fixed in [facility] over 15 months  ago.  She had an intertrochanteric femur fracture and was fixed with a  Synthes trochanteric long nail without distal interlock.  She had  persistent discomfort in the lateral hip.  After obtaining union of her  fracture, she had persistent problems.  We discussed removal of  hardware.  She did have some symptoms down her left lower extremity, but  did continue to have symptoms over the lateral aspect of her hip where  she had a prominent lag screw.  She had some onset of pain in the left  gluteal region, perhaps related to prominent hardware in the gluteal  musculature.  After reviewing the indications and the potential  complications associated with removal of the hardware, as well as the  persistent discomfort in the left hip, she consented to the removal of  the left hip hardware.   DESCRIPTION OF PROCEDURE:  The  patient was brought to operative theater.  Once adequate anesthesia was established and preoperative antibiotics  administered, the patient was positioned in the right lateral decubitus  position with the left side up.  The patient's previous incision was  identified.  The left lower extremity was then  prepped and draped in a  sterile fashion.  The initial incision was made over the lateral aspect  of the hip, consistent with a lag screw placement.  I went ahead and  dissected through the lateral iliotibial band and digitally palpated the  prominent lag screw.  This was exposed and the lag screw inserter with  the inner diameter screw placed and the screws securely fixed.  After  that the proximal fascia was exposed through the gluteus fascia.  I did  bring fluoroscopy into the field and fully draped and flipped over the  table in a U-shaped fashion, to identify the location of the top of the  nail.  I did insert a nail guidewire and then reamed through some  heterotopic bone present in this area.  This allowed for easy exposure.  I then placed  the bolt that was used to tighten the lag screw.  With  this, I removed this back out so that  the lag screw could be removed.  I then removed the lag bolt without complications by hitting it in a  retrograde fashion.  At this point the lag bolt was removed.  The  locking screw was removed from the threads of the proximal femur and the  threaded removing bolt was placed into the proximal aspect of the nail.  The nail was then removed  without complication.  Fluoroscopic imaging  was used to confirm the removal of hardware without complication or  fracture.  Once it was confirmed that it was all removed, the wounds  were all irrigated.  I reapproximated the lateral iliotibial band on the  distal incision with a #1 Vicryl in the gluteus fascia with a #1 Vicryl.  The remaining wound was closed with #2-0 Vicryl and staples on the skin.  The hip wounds  were then cleaned, dried and dressed sterilely with  Adaptic dressing, sponges and tape.   The patient was extubated and brought to the recovery room in stable  condition, without complication.      Madlyn Frankel Charlann Boxer, M.D.  Electronically Signed     MDO/MEDQ  D:  03/30/2006  T:  04/01/2006  Job:  [id number]

## 2010-10-12 IMAGING — RF DG FEMUR 2V*L*
1 series · 4 of 4 positions shown · non-contrast
Comparison: 04/04/2009.

CLINICAL DATA: Femur fracture.

LEFT FEMUR - 2 VIEW

[Series 1: run · 4 of 4 slices shown]
[im 1/4]
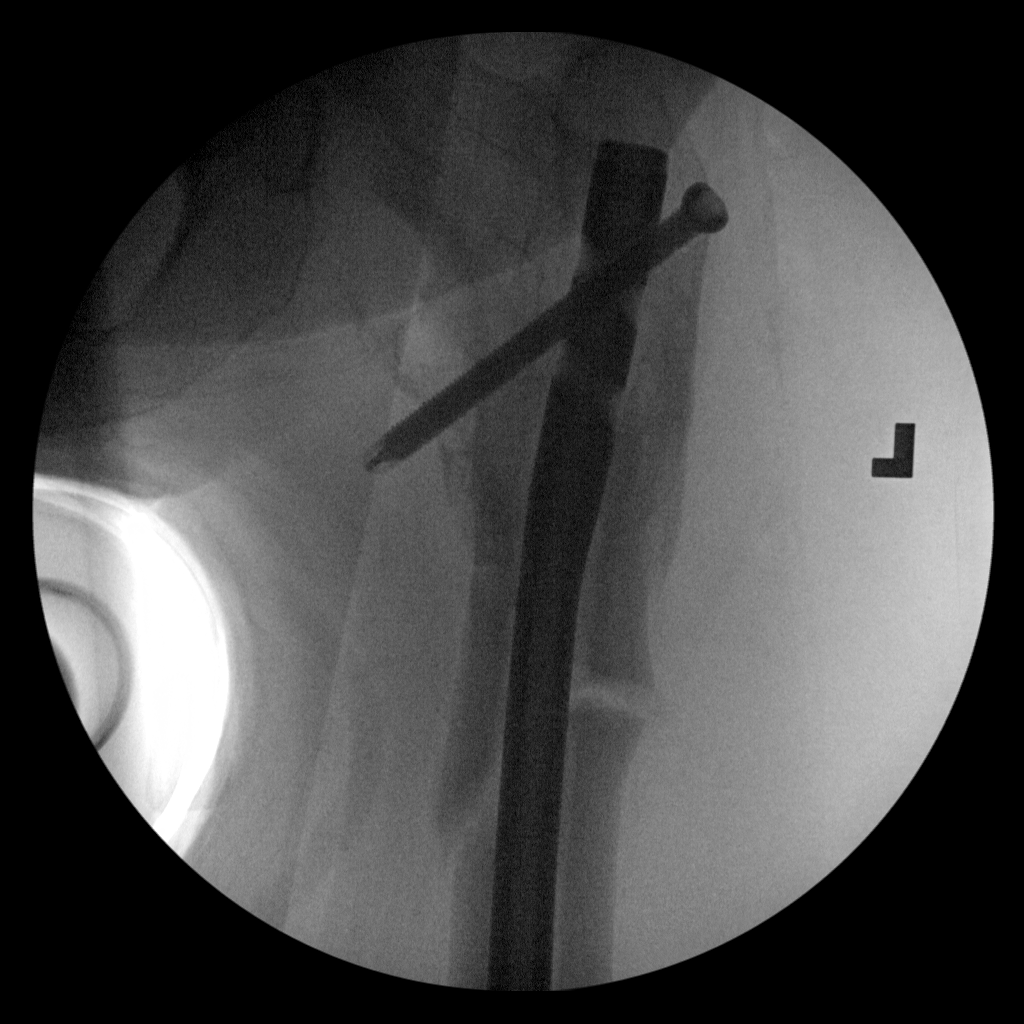
[im 2/4]
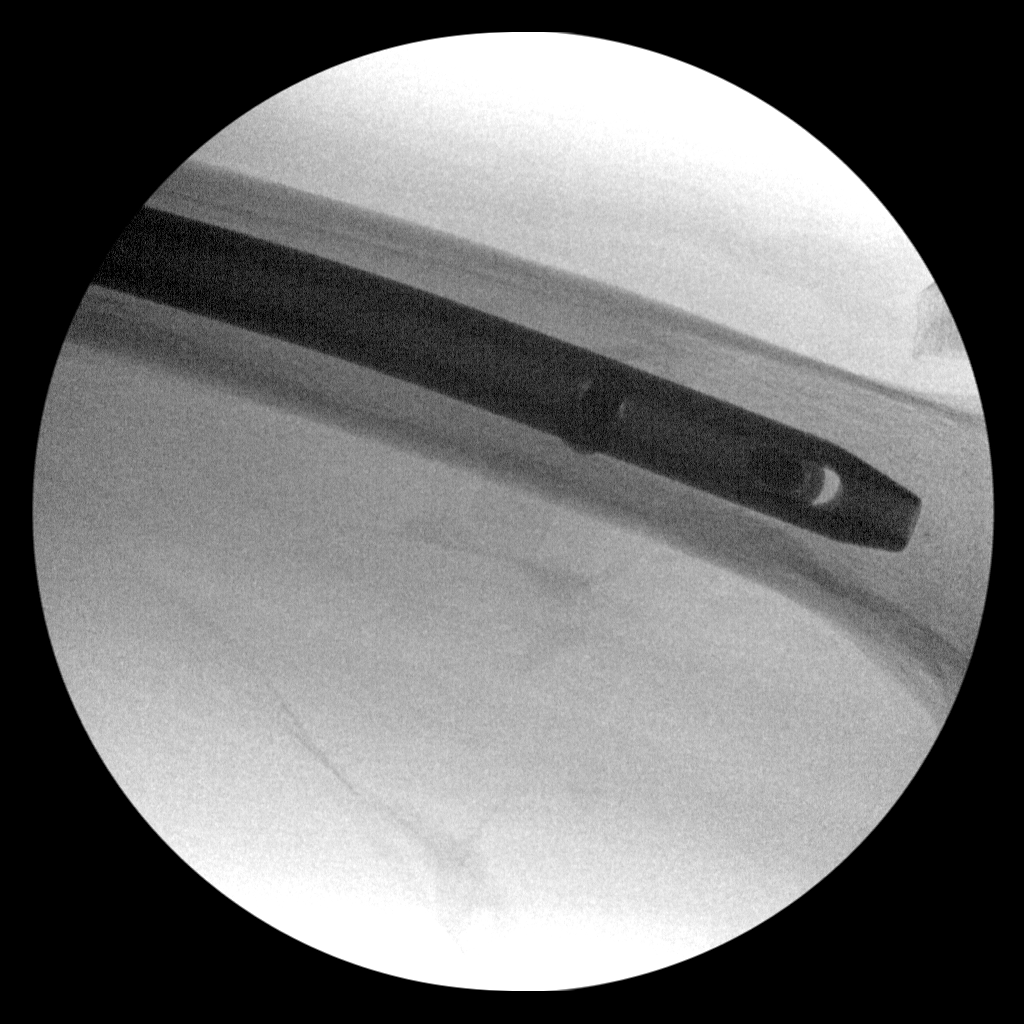
[im 3/4]
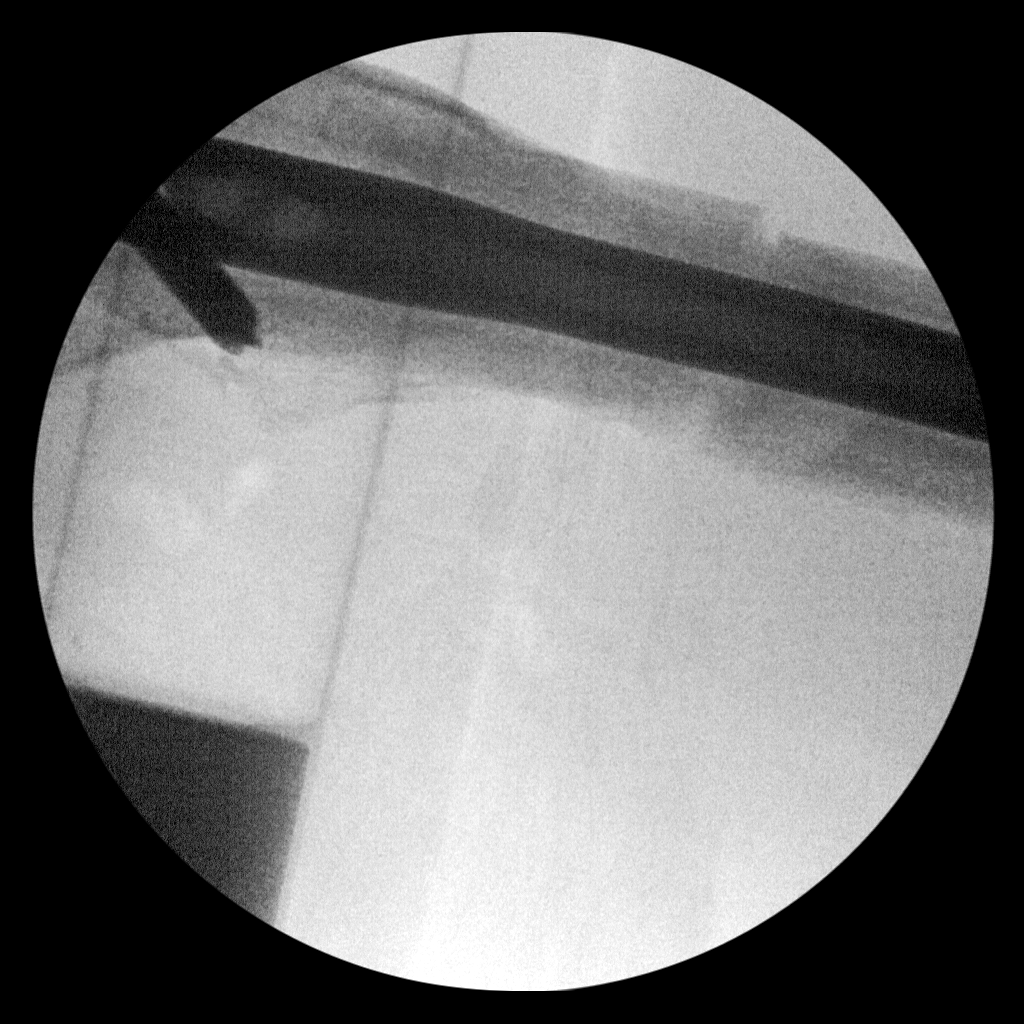
[im 4/4]
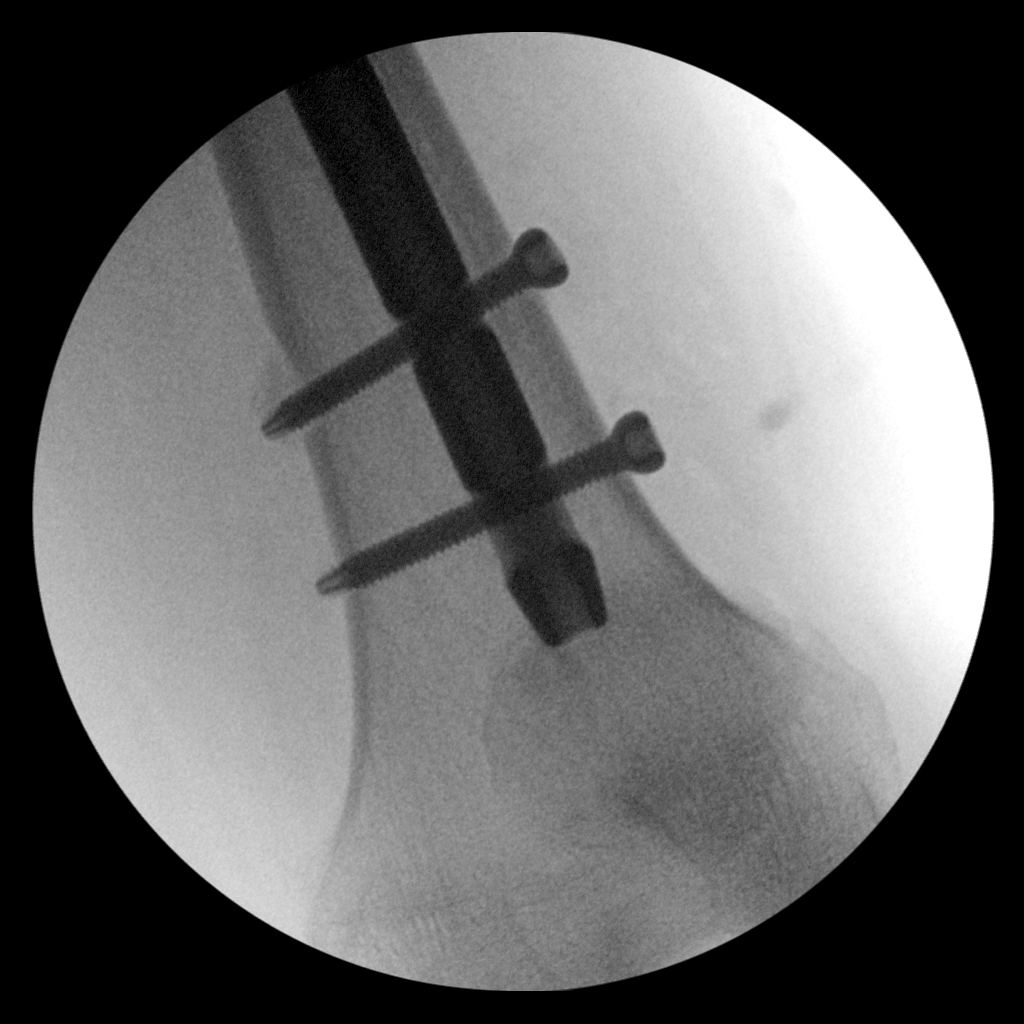

[4 of 4 positions shown; findings below may reference images not displayed]

FINDINGS: Locking IM rod is in place on the left side in
satisfactory position.  There is a fracture of the proximal femoral
shaft in satisfactory alignment.
IMPRESSION: Satisfactory pinning of left femur fracture.

## 2010-10-12 IMAGING — CR DG HIP COMPLETE 2+V*R*
1 series · 1 of 1 positions shown · non-contrast
Comparison: None.

CLINICAL DATA: Fall, pain.

RIGHT HIP - COMPLETE 2+ VIEW

[w hip lat *]
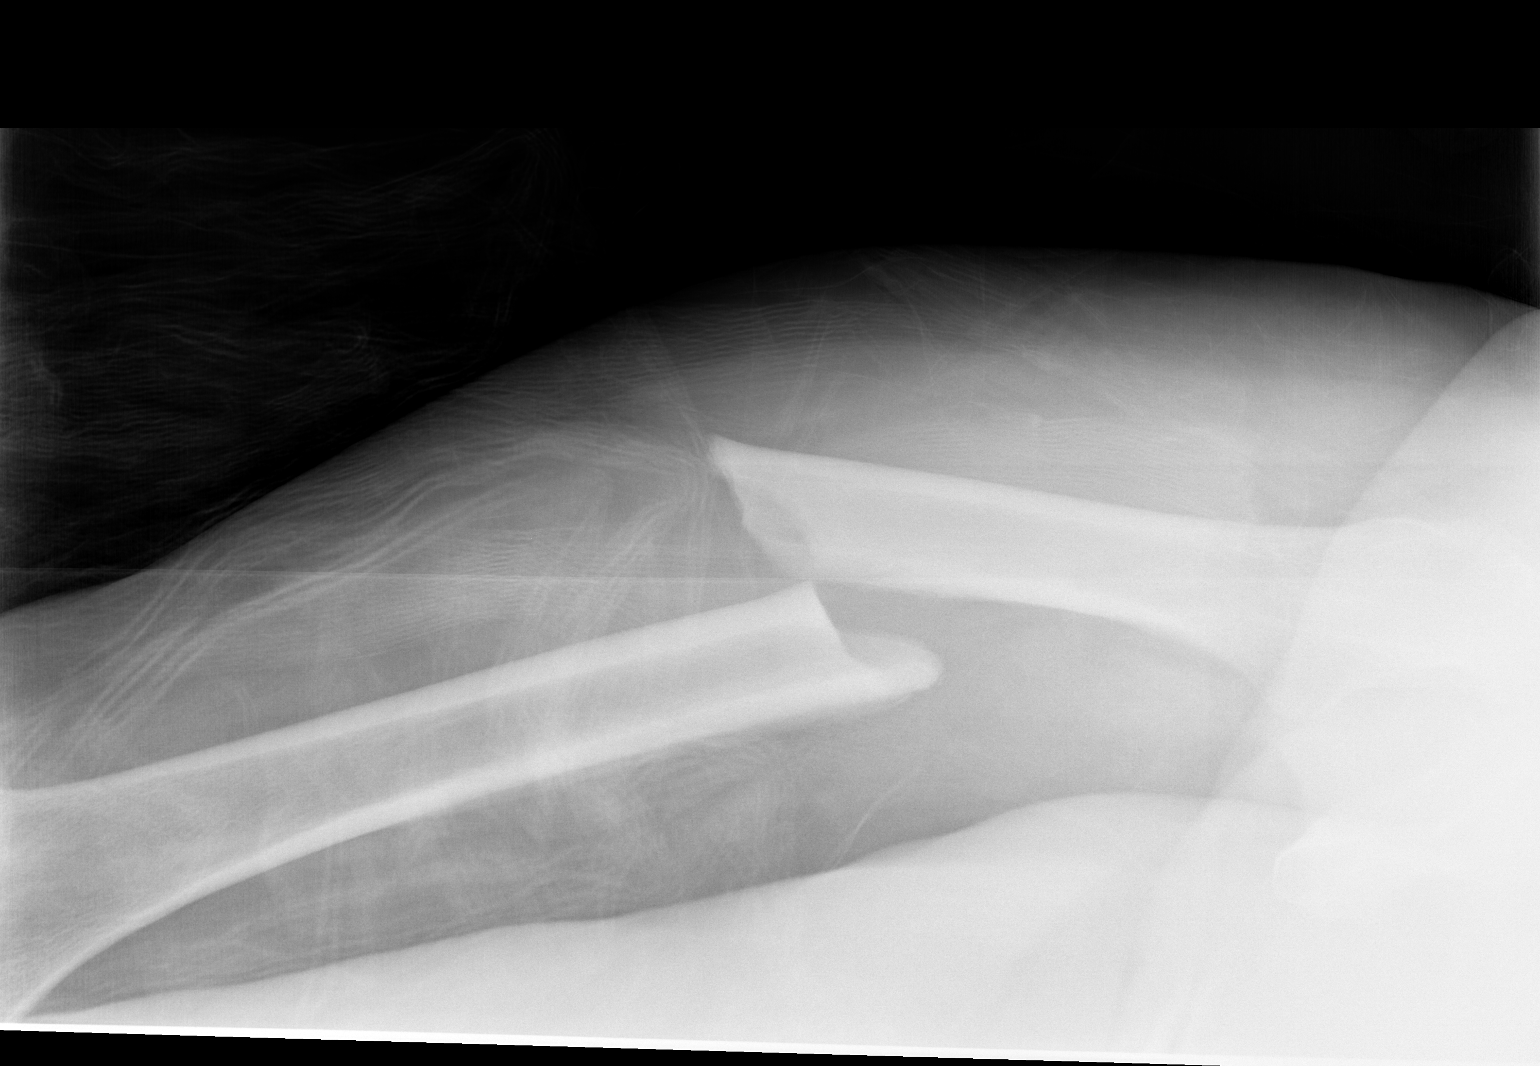

[1 of 1 positions shown; findings below may reference images not displayed]

FINDINGS: The patient has an acute transverse fracture through the
junction of the proximal and middle thirds of the right femur.
There is one shaft width posterior displacement and foreshortening
of approximately 3.5 cm.  No other acute bony or joint abnormality.
IMPRESSION: Acute, transverse fracture proximal diaphysis right femur.

## 2010-10-14 IMAGING — CR DG CHEST 1V PORT
1 series · 1 of 1 positions shown · non-contrast
Comparison: 04/04/2009

CLINICAL DATA: Status post fall, fever left femoral fracture

PORTABLE CHEST - 1 VIEW

[AP]
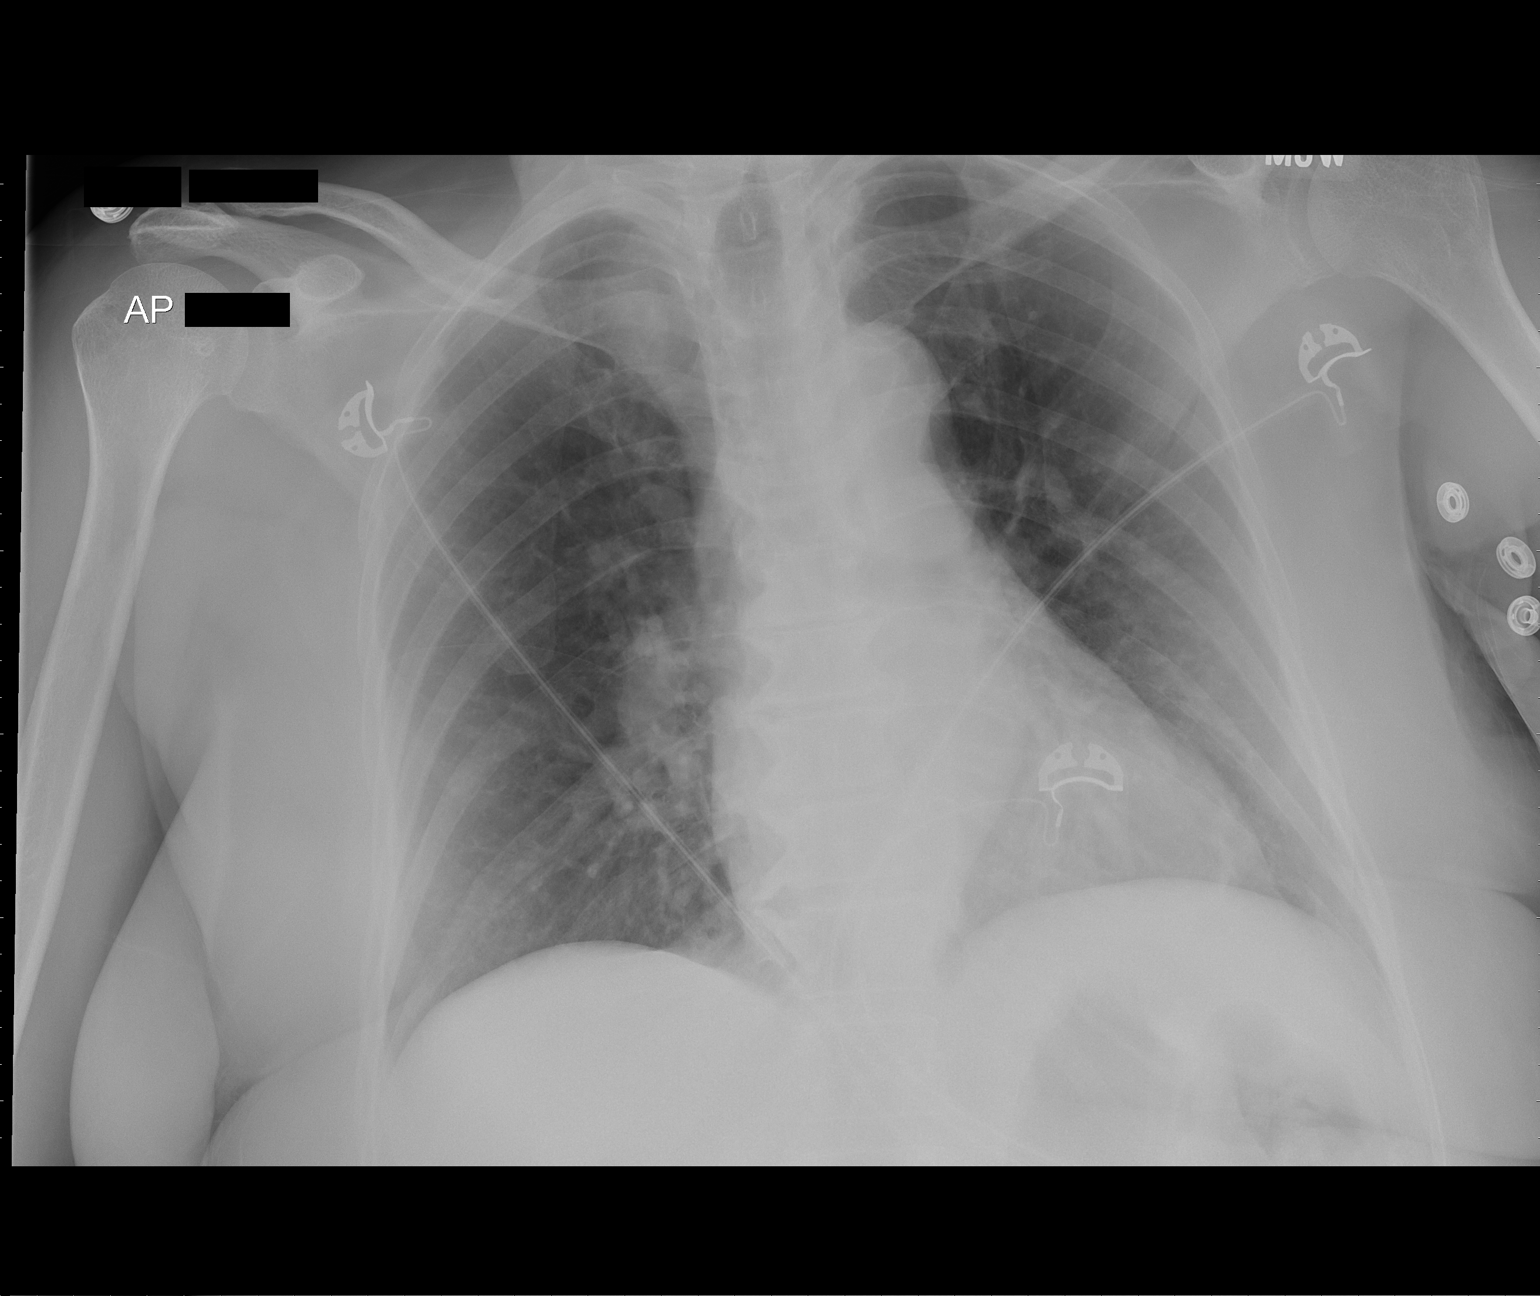

[1 of 1 positions shown; findings below may reference images not displayed]

FINDINGS: Cardiomediastinal silhouette is stable.  Degenerative
changes thoracic spine again noted.  No acute infiltrate or pleural
effusion.  No pulmonary edema.
IMPRESSION: No active disease.  No significant change.

## 2010-10-17 IMAGING — CR DG CHEST 1V PORT
1 series · 1 of 1 positions shown · non-contrast
Comparison: 04/06/2009.

CLINICAL DATA: Fall.  Femoral fractures.

PORTABLE CHEST - 1 VIEW at 5955 hours

[view not recorded]
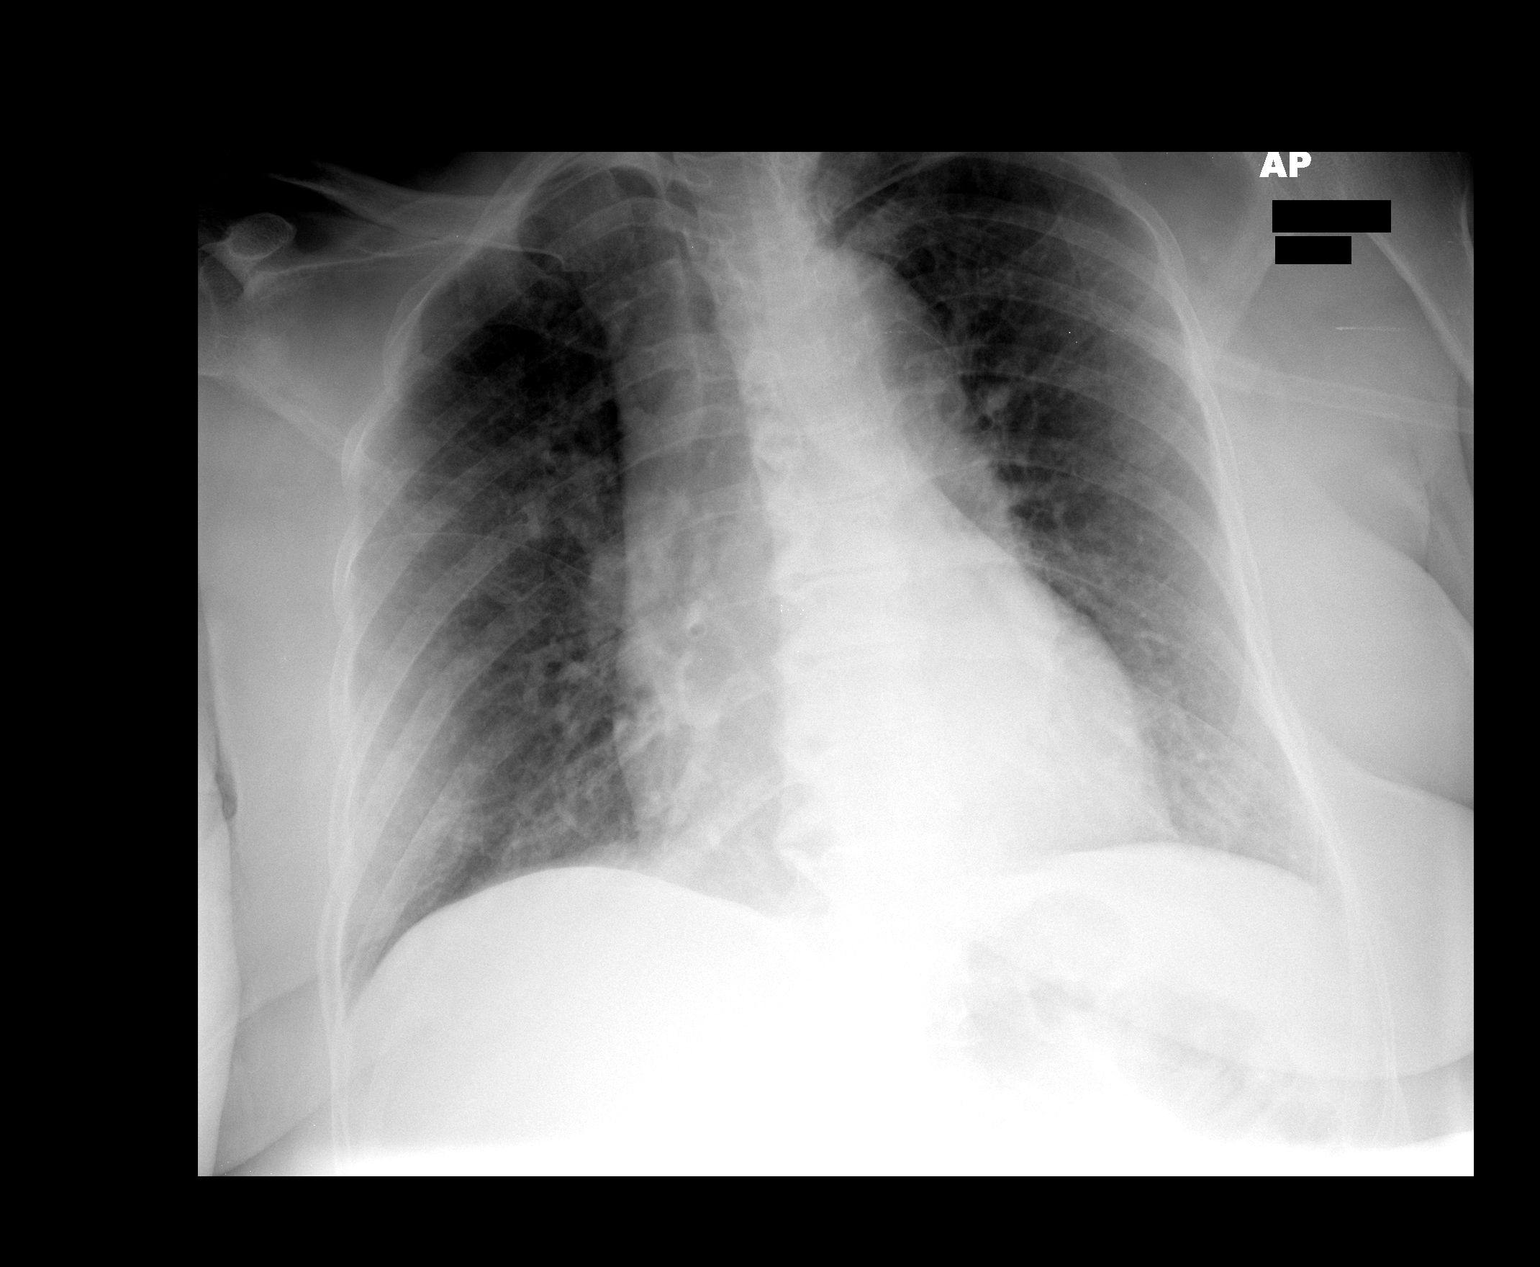

[1 of 1 positions shown; findings below may reference images not displayed]

FINDINGS: Heart appears mildly enlarged.  There is pulmonary
vascular congestion and probably mild interstitial edema. Plate
screw fusion hardware is in the cervical spine.  Spondylosis of the
dorsal spine.
IMPRESSION: CHF versus volume overload.

## 2010-11-22 ENCOUNTER — Other Ambulatory Visit: Payer: Self-pay | Admitting: Family Medicine

## 2010-11-22 DIAGNOSIS — Z1231 Encounter for screening mammogram for malignant neoplasm of breast: Secondary | ICD-10-CM

## 2010-12-21 ENCOUNTER — Ambulatory Visit: Payer: Self-pay

## 2011-01-20 ENCOUNTER — Ambulatory Visit: Payer: Self-pay

## 2013-05-16 ENCOUNTER — Other Ambulatory Visit: Payer: Self-pay

## 2013-05-16 DIAGNOSIS — Z1231 Encounter for screening mammogram for malignant neoplasm of breast: Secondary | ICD-10-CM

## 2013-05-20 ENCOUNTER — Ambulatory Visit
Admission: RE | Admit: 2013-05-20 | Discharge: 2013-05-20 | Disposition: A | Discharge status: Home / Self Care | Payer: Medicare PPO | Source: Ambulatory Visit

## 2013-05-20 DIAGNOSIS — Z1231 Encounter for screening mammogram for malignant neoplasm of breast: Secondary | ICD-10-CM

## 2014-05-16 ENCOUNTER — Other Ambulatory Visit: Payer: Self-pay

## 2014-05-16 DIAGNOSIS — Z1231 Encounter for screening mammogram for malignant neoplasm of breast: Secondary | ICD-10-CM

## 2014-05-29 ENCOUNTER — Ambulatory Visit
Admission: RE | Admit: 2014-05-29 | Discharge: 2014-05-29 | Disposition: A | Discharge status: Home / Self Care | Payer: Medicare PPO | Source: Ambulatory Visit

## 2014-05-29 DIAGNOSIS — Z1231 Encounter for screening mammogram for malignant neoplasm of breast: Secondary | ICD-10-CM

## 2014-09-11 ENCOUNTER — Encounter: Payer: Self-pay | Admitting: Internal Medicine

## 2014-09-11 ENCOUNTER — Ambulatory Visit (INDEPENDENT_AMBULATORY_CARE_PROVIDER_SITE_OTHER): Payer: Medicare PPO | Admitting: Internal Medicine

## 2014-09-11 ENCOUNTER — Other Ambulatory Visit (INDEPENDENT_AMBULATORY_CARE_PROVIDER_SITE_OTHER): Payer: Medicare PPO

## 2014-09-11 VITALS — BP 110/78 | HR 65 | Temp 97.5°F | Resp 16 | Ht 60.0 in | Wt 152.0 lb

## 2014-09-11 DIAGNOSIS — I10 Essential (primary) hypertension: Secondary | ICD-10-CM | POA: Diagnosis not present

## 2014-09-11 DIAGNOSIS — J4520 Mild intermittent asthma, uncomplicated: Secondary | ICD-10-CM

## 2014-09-11 DIAGNOSIS — E039 Hypothyroidism, unspecified: Secondary | ICD-10-CM

## 2014-09-11 DIAGNOSIS — Z0000 Encounter for general adult medical examination without abnormal findings: Secondary | ICD-10-CM

## 2014-09-11 DIAGNOSIS — F4321 Adjustment disorder with depressed mood: Secondary | ICD-10-CM | POA: Diagnosis not present

## 2014-09-11 LAB — COMPREHENSIVE METABOLIC PANEL
ALBUMIN: 4.4 g/dL (ref 3.5–5.2)
ALK PHOS: 43 U/L (ref 39–117)
ALT: 15 U/L (ref 0–35)
AST: 19 U/L (ref 0–37)
BILIRUBIN TOTAL: 0.5 mg/dL (ref 0.2–1.2)
BUN: 12 mg/dL (ref 6–23)
CALCIUM: 9.7 mg/dL (ref 8.4–10.5)
CO2: 29 meq/L (ref 19–32)
CREATININE: 0.78 mg/dL (ref 0.40–1.20)
Chloride: 100 mEq/L (ref 96–112)
GFR: 91.82 mL/min (ref >60.00)
Glucose, Bld: 98 mg/dL (ref 70–99)
Potassium: 4 mEq/L (ref 3.5–5.1)
Sodium: 137 mEq/L (ref 135–145)
TOTAL PROTEIN: 7.8 g/dL (ref 6.0–8.3)

## 2014-09-11 LAB — CBC
HCT: 42.4 % (ref 36.0–46.0)
Hemoglobin: 14.1 g/dL (ref 12.0–15.0)
MCHC: 33.3 g/dL (ref 30.0–36.0)
MCV: 93.9 fl (ref 78.0–100.0)
PLATELETS: 219 10*3/uL (ref 150.0–400.0)
RBC: 4.52 Mil/uL (ref 3.87–5.11)
RDW: 13.6 % (ref 11.5–15.5)
WBC: 6.2 10*3/uL (ref 4.0–10.5)

## 2014-09-11 LAB — LIPID PANEL
CHOLESTEROL: 217 mg/dL — AB (ref 0–200)
HDL: 65.8 mg/dL (ref >39.00)
LDL CALC: 134 mg/dL — AB (ref 0–99)
NonHDL: 151.39
Total CHOL/HDL Ratio: 3
Triglycerides: 86 mg/dL (ref 0.0–149.0)
VLDL: 17.2 mg/dL (ref 0.0–40.0)

## 2014-09-11 LAB — TSH: TSH: 0.56 u[IU]/mL (ref 0.35–4.50)

## 2014-09-11 LAB — LIPASE: LIPASE: 25 U/L (ref 11.0–59.0)

## 2014-09-11 NOTE — Progress Notes (Signed)
Pre visit review using our clinic review tool, if applicable. No additional management support is needed unless otherwise documented below in the visit note. 

## 2014-09-11 NOTE — Patient Instructions (Signed)
We will check the labs today and call you back with the results.   It was a pleasure to meet you today and we will get the records from your previous doctor.   Come back in about 6 months for a check up.

## 2014-09-14 ENCOUNTER — Encounter: Payer: Self-pay | Admitting: Internal Medicine

## 2014-09-14 DIAGNOSIS — E039 Hypothyroidism, unspecified: Secondary | ICD-10-CM | POA: Insufficient documentation

## 2014-09-14 DIAGNOSIS — F4320 Adjustment disorder, unspecified: Secondary | ICD-10-CM | POA: Insufficient documentation

## 2014-09-14 DIAGNOSIS — J45909 Unspecified asthma, uncomplicated: Secondary | ICD-10-CM | POA: Insufficient documentation

## 2014-09-14 DIAGNOSIS — I10 Essential (primary) hypertension: Secondary | ICD-10-CM | POA: Insufficient documentation

## 2014-09-14 NOTE — Assessment & Plan Note (Signed)
New problem today and she does not wish to be on medicine. Talked to her about grief counseling as well as activities to help with stress and depression. She will work on doing fun activities for herself to boost her mood. Also encouraged regular exercise as a way of helping to regulate the hormones in her brain.

## 2014-09-14 NOTE — Progress Notes (Signed)
   Subjective:    Patient ID: Katherine Espinoza, female    DOB: 1934-05-14, 80 y.o.   MRN: [id number]  HPI The patient is a new 80 YO female who is coming in for some mild depression. She has lost 3 of her 4 children at various times in their lives. The last one happened about 1 year ago. She has been under a lot of stress since that time. She has tried to find some time for herself. 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 She has taken the loss very hard. Now she is trying to catch up on her own health since she has put herself second for some time. No new complaints about her health, no problems with her medicines. Does not feel that she wants to try medicine for depression but is going to start some new activities at home for her mood.   PMH, [facility], social history reviewed and updated.   Review of Systems  Constitutional: Negative for fever, activity change, appetite change, fatigue and unexpected weight change.  HENT: Negative.   Eyes: Negative.   Respiratory: Negative for cough, chest tightness, shortness of breath and wheezing.   Cardiovascular: Negative for chest pain, palpitations and leg swelling.  Gastrointestinal: Negative for nausea, abdominal pain, diarrhea, constipation and abdominal distention.  Musculoskeletal: Negative.   Skin: Negative.   Neurological: Negative.   Psychiatric/Behavioral: Positive for dysphoric mood and decreased concentration. Negative for suicidal ideas, behavioral problems, confusion, sleep disturbance, self-injury and agitation. The patient is not nervous/anxious.       Objective:   Physical Exam  Constitutional: She is oriented to person, place, and time. She appears well-developed and well-nourished.  HENT:  Head: Normocephalic and atraumatic.  Eyes: EOM are normal.  Neck: Normal range of motion.  Cardiovascular: Normal rate and regular rhythm.   Pulmonary/Chest: Effort normal and breath sounds normal. No respiratory  distress. She has no wheezes. She has no rales.  Abdominal: Soft. Bowel sounds are normal. She exhibits no distension. There is no tenderness. There is no rebound.  Musculoskeletal: She exhibits no edema.  Neurological: She is alert and oriented to person, place, and time. Coordination normal.  Skin: Skin is warm and dry.  Psychiatric:  Slightly tearful during our conversation.   Filed Vitals:   09/11/14 1501  BP: 110/78  Pulse: 65  Temp: 97.5 F (36.4 C)  TempSrc: Oral  Resp: 16  Height: 5' (1.524 m)  Weight: 152 lb (68.947 kg)  SpO2: 97%      Assessment & Plan:

## 2014-09-14 NOTE — Assessment & Plan Note (Signed)
BP at goal on amlodipine, lisinopril, lopressor, HCTZ. Checking CMP and adjust as needed.

## 2014-09-14 NOTE — Assessment & Plan Note (Signed)
Controlled on advair and rare usage of her albuterol. Not in flare today. Getting records to see past history. Denies intubation ever or recent hospitalization for asthma.

## 2014-09-14 NOTE — Assessment & Plan Note (Signed)
Checking TSH and free T4, decreased levels could be worsening her mood. Currently on 50 mcg daily and adjust as needed.

## 2014-09-19 ENCOUNTER — Telehealth: Payer: Self-pay | Admitting: Internal Medicine

## 2014-09-19 NOTE — Telephone Encounter (Signed)
Patient called to let you know that her Colonoscopy was on 07/01/2011 and her Bone Density was on 12/09/2013

## 2014-09-29 ENCOUNTER — Telehealth: Payer: Self-pay | Admitting: Internal Medicine

## 2014-09-29 NOTE — Telephone Encounter (Signed)
Received records from Dr.Dana Collins/[facility] forwarded 167 pages to Dr. Genella Mech 09/29/14 fbg.

## 2015-02-16 ENCOUNTER — Other Ambulatory Visit: Payer: Self-pay

## 2015-02-16 MED ORDER — POTASSIUM CHLORIDE CRYS ER 20 MEQ PO TBCR: 20.0000 meq | EXTENDED_RELEASE_TABLET | Freq: Once | ORAL | Status: DC

## 2015-02-16 MED ORDER — OMEPRAZOLE 20 MG PO CPDR: 20.0000 mg | DELAYED_RELEASE_CAPSULE | Freq: Every day | ORAL | Status: DC

## 2015-02-16 MED ORDER — TRIAMTERENE-HCTZ 37.5-25 MG PO CAPS: 1.0000 | ORAL_CAPSULE | Freq: Every day | ORAL | Status: DC

## 2015-02-20 ENCOUNTER — Telehealth: Payer: Self-pay | Admitting: Internal Medicine

## 2015-02-20 NOTE — Telephone Encounter (Signed)
[facility] called needing instruction clarification for omeprazole (PRILOSEC) 20 MG capsule [[id number], potassium chloride SA (K-DUR,KLOR-CON) 20 MEQ tablet [[id number]] and triamterene-hydrochlorothiazide (DYAZIDE) 37.5-25 MG capsule [[id number]]  The phone # is [phone] Order# ending in [id number]

## 2015-02-20 NOTE — Telephone Encounter (Signed)
Called and spoke with pharmacist to clarify order.

## 2015-03-03 ENCOUNTER — Ambulatory Visit (INDEPENDENT_AMBULATORY_CARE_PROVIDER_SITE_OTHER): Payer: Medicare PPO | Admitting: Internal Medicine

## 2015-03-03 ENCOUNTER — Telehealth: Payer: Self-pay | Admitting: Internal Medicine

## 2015-03-03 ENCOUNTER — Encounter: Payer: Self-pay | Admitting: Internal Medicine

## 2015-03-03 VITALS — BP 140/80 | HR 53 | Temp 98.3°F | Ht 60.0 in | Wt 154.0 lb

## 2015-03-03 DIAGNOSIS — I10 Essential (primary) hypertension: Secondary | ICD-10-CM

## 2015-03-03 DIAGNOSIS — Z23 Encounter for immunization: Secondary | ICD-10-CM | POA: Diagnosis not present

## 2015-03-03 DIAGNOSIS — F32A Depression, unspecified: Secondary | ICD-10-CM | POA: Insufficient documentation

## 2015-03-03 DIAGNOSIS — M5412 Radiculopathy, cervical region: Secondary | ICD-10-CM | POA: Insufficient documentation

## 2015-03-03 DIAGNOSIS — J309 Allergic rhinitis, unspecified: Secondary | ICD-10-CM

## 2015-03-03 DIAGNOSIS — F329 Major depressive disorder, single episode, unspecified: Secondary | ICD-10-CM

## 2015-03-03 MED ORDER — CITALOPRAM HYDROBROMIDE 10 MG PO TABS: 10.0000 mg | ORAL_TABLET | Freq: Every day | ORAL | Status: DC

## 2015-03-03 MED ORDER — NEPAFENAC 0.3 % OP SUSP: 1.0000 [drp] | Freq: Every day | OPHTHALMIC | Status: DC

## 2015-03-03 NOTE — Telephone Encounter (Signed)
Patient aware and will take one potassium pill daily.

## 2015-03-03 NOTE — Progress Notes (Signed)
Pre visit review using our clinic review tool, if applicable. No additional management support is needed unless otherwise documented below in the visit note. 

## 2015-03-03 NOTE — Patient Instructions (Signed)
You had the flu shot today  Please take all new medication as prescribed - the celexa 10 mg (sent to [facility] mail order)  Please continue all other medications as before, and refills have been done if requested.  Please have the pharmacy call with any other refills you may need.  Please continue your efforts at being more active, low cholesterol diet, and weight control.  You are otherwise up to date with prevention measures today.  Please keep your appointments with your specialists as you may have planned - Dr Madie Reno for allergies  You will be contacted regarding the referral for: orthopedic ([facility])  .

## 2015-03-03 NOTE — Telephone Encounter (Signed)
Pt called stated that potassium chloride SA (K-DUR,KLOR-CON) 20 MEQ tablet need to be send into [facility] for 90 day supply. Pt stated that we send in on 02/16/15 but the direction need to be change to take 1 Tab twice daily. Please check and resend the rx. Pt is trying to get this done for couple weeks now.

## 2015-03-03 NOTE — Telephone Encounter (Signed)
Katherine Espinoza is calling back in. Patient insists that the potassium should be a sig of 2 per day. We only show 1, but [facility] has dr Doristine Locks ( her previous provider) RX 2 per day. She is asking for this to be changed. Please call [facility] to clarify using the same order # below.

## 2015-03-03 NOTE — Telephone Encounter (Signed)
Please call pt

## 2015-03-03 NOTE — Progress Notes (Signed)
Subjective:    Patient ID: Katherine Espinoza, female    DOB: [birth year], 81 y.o.   MRN: [id number]  HPI  Here with 2 wks onset pain to right neck and shoulder with radiation to arm, assoc with tingling and numbness, no weakness , some better this wk.  Pt denies bowel or bladder change, fever, wt loss,  worsening LE pain/numbness/weakness, gait change or falls. Worse to lie down, nothing makes better, overall now moderate  She was talking with daughter and both believe depression is a significant problem as well, having some  Increased stressors, and thinging of moving to [place].  Has been to counseling, thinks would help again. Denies SI or HI., worsening x 1-2 months  Does have several wks ongoing nasal allergy symptoms with clearish congestion, itch and sneezing, without fever, pain, ST, cough, swelling or wheezing. Past Medical History  Diagnosis Date  . Asthma   . Arthritis   . Glaucoma   . History of stomach ulcers   . Allergy   . Hyperlipidemia   . Hypertension   . Thyroid disease    Past Surgical History  Procedure Laterality Date  . Abdominal hysterectomy    . Spine surgery    . Hip surgery    . Knee surgery    . Neck surgery    . Foot surgery    . Femur fracture surgery Bilateral   . Incontinence surgery      reports that she has quit smoking. She does not have any smokeless tobacco history on file. She reports that she does not drink alcohol or use illicit drugs. family history includes Arthritis in her father; Cancer in her mother and sister; Diabetes in her sister; Heart disease in her daughter, father, and sister; Hypertension in her father and sister. Allergies  Allergen Reactions  . Contrast Media [Iodinated Diagnostic Agents]    Current Outpatient Prescriptions on File Prior to Visit  Medication Sig Dispense Refill  . albuterol (PROVENTIL HFA;VENTOLIN HFA) 108 (90 BASE) MCG/ACT inhaler Inhale 2 puffs into the lungs every 6 (six) hours as needed for  wheezing or shortness of breath.    [name] amLODipine (NORVASC) 5 MG tablet Take 5 mg by mouth daily.     [name] aspirin 81 MG tablet Take 81 mg by mouth daily.    . Calcium-Vitamin D (CALTRATE 600 PLUS-VIT D PO) Take by mouth.    . dorzolamide-timolol (COSOPT) 22.3-6.8 MG/ML ophthalmic solution     . Fluticasone-Salmeterol (ADVAIR DISKUS) 100-50 MCG/DOSE AEPB Inhale into the lungs daily.    [name] gabapentin (NEURONTIN) 100 MG capsule Take 100 mg by mouth daily.     [name] levothyroxine (SYNTHROID, LEVOTHROID) 50 MCG tablet Take 50 mcg by mouth daily before breakfast.     . lisinopril (PRINIVIL,ZESTRIL) 40 MG tablet Take 40 mg by mouth daily.     . metoprolol tartrate (LOPRESSOR) 25 MG tablet Take 25 mg by mouth 2 (two) times daily.     . Multiple Vitamins-Minerals (MULTIVITAMIN WITH MINERALS) tablet Take 1 tablet by mouth daily.    [name] omeprazole (PRILOSEC) 20 MG capsule Take 1 capsule (20 mg total) by mouth daily. 90 capsule 0  . oxyCODONE-acetaminophen (PERCOCET) 7.5-325 MG per tablet TK 1 T PO TID PRF PAIN  0  . potassium chloride SA (K-DUR,KLOR-CON) 20 MEQ tablet Take 1 tablet (20 mEq total) by mouth once. 90 tablet 0  . TRAVATAN Z 0.004 % SOLN ophthalmic solution     . triamterene-hydrochlorothiazide (DYAZIDE) 37.5-25 MG  capsule Take 1 each (1 capsule total) by mouth daily. 90 capsule 0   No current facility-administered medications on file prior to visit.     Review of Systems  Constitutional: Negative for unusual diaphoresis or night sweats HENT: Negative for ringing in ear or discharge Eyes: Negative for double vision or worsening visual disturbance.  Respiratory: Negative for choking and stridor.   Gastrointestinal: Negative for vomiting or other signifcant bowel change Genitourinary: Negative for hematuria or change in urine volume.  Musculoskeletal: Negative for other MSK pain or swelling Skin: Negative for color change and worsening wound.  Neurological: Negative for tremors and numbness other  than noted  Psychiatric/Behavioral: Negative for decreased concentration or agitation other than above       Objective:   Physical Exam BP 140/80 mmHg  Pulse 53  Temp(Src) 98.3 F (36.8 C) (Oral)  Ht 5' (1.524 m)  Wt 154 lb (69.854 kg)  BMI 30.08 kg/m2  SpO2 94% VS noted, non toxic Constitutional: Pt appears in no significant distress HENT: Head: NCAT.  Right Ear: External ear normal.  Left Ear: External ear normal.  Eyes: . Pupils are equal, round, and reactive to light. Conjunctivae and EOM are normal Bilat tm's with mild erythema.  Max sinus areas non tender.  Pharynx with mild erythema, no exudate Neck: Normal range of motion. Neck supple.  Cardiovascular: Normal rate and regular rhythm.   Pulmonary/Chest: Effort normal and breath sounds without rales or wheezing.  Abd:  Soft, NT, ND, + BS Neurological: Pt is alert. Not confused , motor grossly intact except 4+/5 RUE with decreased sens to LT Skin: Skin is warm. No rash, no LE edema Psychiatric: Pt behavior is normal. No agitation. depressed affect   Assessment & Plan:

## 2015-03-03 NOTE — Telephone Encounter (Signed)
Spoke with patient. She is to take one potassium pill daily. Rx was correct that was sent to [facility].

## 2015-03-03 NOTE — Telephone Encounter (Signed)
I would recommend to only take once per day due to her other medications.

## 2015-03-08 NOTE — Assessment & Plan Note (Signed)
Mild , pt declines further tx now, plans to f/u with allergist,  to f/u any worsening symptoms or concerns

## 2015-03-08 NOTE — Assessment & Plan Note (Signed)
stable overall by history and exam, recent data reviewed with pt, and pt to continue medical treatment as before,  to f/u any worsening symptoms or concerns BP Readings from Last 3 Encounters:  03/03/15 140/80  09/11/14 110/78

## 2015-03-08 NOTE — Assessment & Plan Note (Signed)
Mild to mod, with neuro change, for ortho referral,  to f/u any worsening symptoms or concerns

## 2015-03-08 NOTE — Assessment & Plan Note (Signed)
Mild to mod, pt plans to self refer counseling,   to f/u any worsening symptoms or concerns, verified no SI or HI, for celexa 10 qd,  to f/u any worsening symptoms or concerns

## 2015-03-13 ENCOUNTER — Ambulatory Visit (INDEPENDENT_AMBULATORY_CARE_PROVIDER_SITE_OTHER): Payer: Medicare PPO | Admitting: Internal Medicine

## 2015-03-13 ENCOUNTER — Encounter: Payer: Self-pay | Admitting: Internal Medicine

## 2015-03-13 VITALS — BP 112/70 | HR 64 | Temp 99.0°F | Resp 12 | Ht 60.0 in | Wt 153.8 lb

## 2015-03-13 DIAGNOSIS — F4321 Adjustment disorder with depressed mood: Secondary | ICD-10-CM | POA: Diagnosis not present

## 2015-03-13 DIAGNOSIS — Z23 Encounter for immunization: Secondary | ICD-10-CM | POA: Diagnosis not present

## 2015-03-13 NOTE — Progress Notes (Signed)
Pre visit review using our clinic review tool, if applicable. No additional management support is needed unless otherwise documented below in the visit note. 

## 2015-03-13 NOTE — Patient Instructions (Signed)
You can stop taking the amlodipine as I think the blood pressure is doing really well.   Keep taking the other medicines.   We have given you the pneumonia shot today.

## 2015-03-13 NOTE — Progress Notes (Signed)
   Subjective:    Patient ID: Katherine Espinoza, female    DOB: 12-03-1934, 81 y.o.   MRN: [id number]  HPI The patient is a 81 YO female coming in for follow up on her adjustment disorder. She is still struggling with the loss of some of her children recently. She is close to her family in [place] and had their support over the holidays. Started taking celexa this week without any change in symptoms. She is not having any side effects from it. No new symptoms. Sleeping okay. Still seeing orthopedics for her chronic back pain and still using her oxycodone TID.   Review of Systems  Constitutional: Negative for fever, activity change, appetite change, fatigue and unexpected weight change.  HENT: Negative.   Eyes: Negative.   Respiratory: Negative for cough, chest tightness, shortness of breath and wheezing.   Cardiovascular: Negative for chest pain, palpitations and leg swelling.  Gastrointestinal: Negative for nausea, abdominal pain, diarrhea, constipation and abdominal distention.  Musculoskeletal: Negative.   Skin: Negative.   Neurological: Negative.   Psychiatric/Behavioral: Positive for dysphoric mood and decreased concentration. Negative for suicidal ideas, behavioral problems, confusion, sleep disturbance, self-injury and agitation. The patient is not nervous/anxious.       Objective:   Physical Exam  Constitutional: She is oriented to person, place, and time. She appears well-developed and well-nourished.  HENT:  Head: Normocephalic and atraumatic.  Eyes: EOM are normal.  Neck: Normal range of motion.  Cardiovascular: Normal rate and regular rhythm.   Pulmonary/Chest: Effort normal and breath sounds normal. No respiratory distress. She has no wheezes. She has no rales.  Abdominal: Soft. Bowel sounds are normal. She exhibits no distension. There is no tenderness. There is no rebound.  Musculoskeletal: She exhibits no edema.  Neurological: She is alert and oriented to person,  place, and time. Coordination normal.  Skin: Skin is warm and dry.  Psychiatric:  Slightly tearful during our conversation.   Filed Vitals:   03/13/15 1102  BP: 112/70  Pulse: 64  Temp: 99 F (37.2 C)  TempSrc: Oral  Resp: 12  Height: 5' (1.524 m)  Weight: 153 lb 12.8 oz (69.763 kg)  SpO2: 96%      Assessment & Plan:  Prevnar 13 given at visit.

## 2015-03-13 NOTE — Assessment & Plan Note (Signed)
Doing okay on celexa so far and encouraged her to continue and that it may take 4 weeks for it to get to full effect. She will call us back and if needed can increase dose.

## 2015-04-06 ENCOUNTER — Ambulatory Visit (INDEPENDENT_AMBULATORY_CARE_PROVIDER_SITE_OTHER): Payer: Medicare PPO | Admitting: Family Medicine

## 2015-04-06 ENCOUNTER — Encounter: Payer: Self-pay | Admitting: Family Medicine

## 2015-04-06 VITALS — BP 140/90 | HR 79 | Temp 98.9°F | Wt 150.0 lb

## 2015-04-06 DIAGNOSIS — J069 Acute upper respiratory infection, unspecified: Secondary | ICD-10-CM

## 2015-04-06 NOTE — Progress Notes (Signed)
Tana ConchStephen Hunter, MD  Subjective:  Pershing ProudMartha A Espinoza is a 81 y.o. year old very pleasant female patient who presents for/with See problem oriented charting ROS- no nausea, vomiting. No shortness of breath.  Has had some nasal drainage but no sinus pressure.   Past Medical History-  Patient Active Problem List   Diagnosis Date Noted  . Right cervical radiculopathy 03/03/2015  . Depression 03/03/2015  . Allergic rhinitis 03/03/2015  . Adjustment disorder 09/14/2014  . Asthma, chronic 09/14/2014  . Essential hypertension 09/14/2014  . Hypothyroidism 09/14/2014    Medications- reviewed and updated Current Outpatient Prescriptions  Medication Sig Dispense Refill  . aspirin 81 MG tablet Take 81 mg by mouth daily.    . Calcium-Vitamin D (CALTRATE 600 PLUS-VIT D PO) Take by mouth.    . citalopram (CELEXA) 10 MG tablet Take 1 tablet (10 mg total) by mouth daily. 90 tablet 3  . dorzolamide-timolol (COSOPT) 22.3-6.8 MG/ML ophthalmic solution     . Fluticasone-Salmeterol (ADVAIR DISKUS) 100-50 MCG/DOSE AEPB Inhale into the lungs daily.    [name]. gabapentin (NEURONTIN) 100 MG capsule Take 100 mg by mouth daily.     [name]. levothyroxine (SYNTHROID, LEVOTHROID) 50 MCG tablet Take 50 mcg by mouth daily before breakfast.     . lisinopril (PRINIVIL,ZESTRIL) 40 MG tablet Take 40 mg by mouth daily.     . metoprolol tartrate (LOPRESSOR) 25 MG tablet Take 25 mg by mouth 2 (two) times daily.     . Multiple Vitamins-Minerals (MULTIVITAMIN WITH MINERALS) tablet Take 1 tablet by mouth daily.    . nepafenac (ILEVRO) 0.3 % ophthalmic suspension Place 1 drop into the left eye daily. 3 mL 1  . omeprazole (PRILOSEC) 20 MG capsule Take 1 capsule (20 mg total) by mouth daily. 90 capsule 0  . potassium chloride SA (K-DUR,KLOR-CON) 20 MEQ tablet Take 1 tablet (20 mEq total) by mouth once. 90 tablet 0  . TRAVATAN Z 0.004 % SOLN ophthalmic solution     . triamterene-hydrochlorothiazide (DYAZIDE) 37.5-25 MG capsule Take 1 each  (1 capsule total) by mouth daily. 90 capsule 0  . albuterol (PROVENTIL HFA;VENTOLIN HFA) 108 (90 BASE) MCG/ACT inhaler Inhale 2 puffs into the lungs every 6 (six) hours as needed for wheezing or shortness of breath. Reported on 04/06/2015    . oxyCODONE-acetaminophen (PERCOCET) 7.5-325 MG per tablet Reported on 04/06/2015  0   No current facility-administered medications for this visit.    Objective: BP 140/90 mmHg  Pulse 79  Temp(Src) 98.9 F (37.2 C)  Wt 150 lb (68.04 kg)  SpO2 96% Gen: NAD, resting comfortably Tm normal, no sinus pressure, erythematous nares with clear drainage, oropharynx normal, no tender lymphadenopathy CV: RRR no murmurs rubs or gallops Lungs: CTAB no crackles, wheeze, rhonchi Abdomen: soft/nontender/nondistended/normal bowel sounds. Skin: warm, dry Neuro: grossly normal, moves all extremities, slow gait  Assessment/Plan:  Cough/congestion/likely URI S:for about a week has been ill. Does have bad allergies at baseline and sees Dr. Irena CordsVan Winkle. Has tried eating soup and drinking ginger ale. Has been coughing up yellow mucus. Seemed to start with soreness in left side of neck. Had some mild headaches as well. Started to feel better and then worsened again. Has spent a lot of time in bed resting. Had been running a fever up to 101.3 and that was on the 16th but was her last day of fever. Has wheezed some more than normal and has had to use albuterol more often. Is compliant with advair. Some mild diarrhea.  A/P: Suspect viral illness (URI). Advised continued rest and hydration.  Wanted to make sure this is not pneumonia and with clear lungs, now afebrile and shortness of breath I reassured her that it was not. She does have known asthma but only mild increase in wheezing- no clear asthma exacerbation- not wheezing today despite no albuterol  Return precautions advised.

## 2015-04-06 NOTE — Patient Instructions (Signed)
Return if you have fever, shortness of breath, wheeze, or symptoms that last more than another week. Otherwise, this should resolve on its own.   Upper Respiratory Infection, Adult Most upper respiratory infections (URIs) are a viral infection of the air passages leading to the lungs. A URI affects the nose, throat, and upper air passages. The most common type of URI is nasopharyngitis and is typically referred to as "the common cold." URIs run their course and usually go away on their own. Most of the time, a URI does not require medical attention, but sometimes a bacterial infection in the upper airways can follow a viral infection. This is called a secondary infection. Sinus and middle ear infections are common types of secondary upper respiratory infections. Bacterial pneumonia can also complicate a URI. A URI can worsen asthma and chronic obstructive pulmonary disease (COPD). Sometimes, these complications can require emergency medical care and may be life threatening.  CAUSES Almost all URIs are caused by viruses. A virus is a type of germ and can spread from one person to another.  RISKS FACTORS You may be at risk for a URI if:   You smoke.   You have chronic heart or lung disease.  You have a weakened defense (immune) system.   You are very young or very old.   You have nasal allergies or asthma.  You work in crowded or poorly ventilated areas.  You work in health care facilities or schools. SIGNS AND SYMPTOMS  Symptoms typically develop 2-3 days after you come in contact with a cold virus. Most viral URIs last 7-10 days. However, viral URIs from the influenza virus (flu virus) can last 14-18 days and are typically more severe. Symptoms may include:   Runny or stuffy (congested) nose.   Sneezing.   Cough.   Sore throat.   Headache.   Fatigue.   Fever.   Loss of appetite.   Pain in your forehead, behind your eyes, and over your cheekbones (sinus  pain).  Muscle aches.  DIAGNOSIS  Your health care provider may diagnose a URI by:  Physical exam.  Tests to check that your symptoms are not due to another condition such as:  Strep throat.  Sinusitis.  Pneumonia.  Asthma. TREATMENT  A URI goes away on its own with time. It cannot be cured with medicines, but medicines may be prescribed or recommended to relieve symptoms. Medicines may help:  Reduce your fever.  Reduce your cough.  Relieve nasal congestion. HOME CARE INSTRUCTIONS   Take medicines only as directed by your health care provider.   Gargle warm saltwater or take cough drops to comfort your throat as directed by your health care provider.  Use a warm mist humidifier or inhale steam from a shower to increase air moisture. This may make it easier to breathe.  Drink enough fluid to keep your urine clear or pale yellow.   Eat soups and other clear broths and maintain good nutrition.   Rest as needed.   Return to work when your temperature has returned to normal or as your health care provider advises. You may need to stay home longer to avoid infecting others. You can also use a face mask and careful hand washing to prevent spread of the virus.  Increase the usage of your inhaler if you have asthma.   Do not use any tobacco products, including cigarettes, chewing tobacco, or electronic cigarettes. If you need help quitting, ask your health care provider. PREVENTION  The best way to protect yourself from getting a cold is to practice good hygiene.   Avoid oral or hand contact with people with cold symptoms.   Wash your hands often if contact occurs.  There is no clear evidence that vitamin C, vitamin E, echinacea, or exercise reduces the chance of developing a cold. However, it is always recommended to get plenty of rest, exercise, and practice good nutrition.  SEEK MEDICAL CARE IF:   You are getting worse rather than better.   Your symptoms are  not controlled by medicine.   You have chills.  You have worsening shortness of breath.  You have brown or red mucus.  You have yellow or brown nasal discharge.  You have pain in your face, especially when you bend forward.  You have a fever.  You have swollen neck glands.  You have pain while swallowing.  You have white areas in the back of your throat. SEEK IMMEDIATE MEDICAL CARE IF:   You have severe or persistent:  Headache.  Ear pain.  Sinus pain.  Chest pain.  You have chronic lung disease and any of the following:  Wheezing.  Prolonged cough.  Coughing up blood.  A change in your usual mucus.  You have a stiff neck.  You have changes in your:  Vision.  Hearing.  Thinking.  Mood. MAKE SURE YOU:   Understand these instructions.  Will watch your condition.  Will get help right away if you are not doing well or get worse.   This information is not intended to replace advice given to you by your health care provider. Make sure you discuss any questions you have with your health care provider.   Document Released: 06/29/2000 Document Revised: 05/20/2014 Document Reviewed: 04/10/2013 Elsevier Interactive Patient Education [employer].

## 2015-04-08 ENCOUNTER — Telehealth: Payer: Self-pay | Admitting: Internal Medicine

## 2015-04-08 NOTE — Telephone Encounter (Signed)
Patient Name: Katherine Espinoza  DOB: Dec 29, 1934    Initial Comment caller states she has a headache, cough and wheezing   Nurse Assessment  Nurse: [name], RN, [name] Date/Time [name](Eastern Time): 04/08/2015 8:53:42 AM  Confirm and document reason for call. If symptomatic, describe symptoms. You must click the next button to save text entered. ---Caller states that she was seen Monday. She has a URI. She has a cough, wheezing, and a headache. She has used the inhaler for the wheezing and it helps. The headache began yesterday. She rates her headache pain as 8-9 on 0-10 scale. She had been taking Tylenol which "helped some." The headache began "kinda rough." She has pain in her neck also. She is not able to get her chin to touch her chest by 1".  Has the patient traveled out of the country within the last 30 days? ---No  Does the patient have any new or worsening symptoms? ---Yes  Will a triage be completed? ---Yes  Related visit to physician within the last 2 weeks? ---No  Does the PT have any chronic conditions? (i.e. diabetes, asthma, etc.) ---Yes  List chronic conditions. ---Asthma  Is this a behavioral health or substance abuse call? ---No     Guidelines    Guideline Title Affirmed Question Affirmed Notes       Final Disposition User        Comments  Caller became irritated with my questions. She states that she was just seen Monday by Dr. Durene CalHunter. Just wants to see if Dr. Durene CalHunter can go ahead and call her something in for this. I reminded her that her headache began after she was seen. She had told me it began yesterday. He may need to see her again. She still wanted to see if something can be called in for her. She lives alone and doesn't have transportation to get there. I called the [facility] office and spoke with [name] who states she will need to send a note to Dr. Erasmo LeventhalHunter's nurse and someone will be calling her back. Caller notified. Verbalized understanding.

## 2015-05-12 ENCOUNTER — Other Ambulatory Visit: Payer: Self-pay | Admitting: Internal Medicine

## 2015-05-13 ENCOUNTER — Other Ambulatory Visit: Payer: Self-pay | Admitting: Internal Medicine

## 2015-05-21 ENCOUNTER — Telehealth: Payer: Self-pay | Admitting: Internal Medicine

## 2015-05-21 NOTE — Telephone Encounter (Signed)
Pt request to speak to the assistant concern about bill that she had when she saw Dr. Jonny RuizJohn on 03/03/15. Please call back, she is confuse and unsure on how to fix it.

## 2015-05-25 NOTE — Telephone Encounter (Signed)
Spoke with patient, she seems confused about the invoice. DOS 03/03/15 was paid in full, the $235.00 invoice is coming from 03/13/15 with Dr. Okey Duprerawford. [employer] is not covering her pneumonia shot as they only allow one per lifetime. Informed her I did not see a record of any previous pneumonia vaccination and that it is her responsibility to know her specific insurance benefits. Recommended she contact [employer] to confirm the coverage for this service.

## 2015-07-07 ENCOUNTER — Ambulatory Visit (INDEPENDENT_AMBULATORY_CARE_PROVIDER_SITE_OTHER): Payer: Medicare PPO | Admitting: Internal Medicine

## 2015-07-07 ENCOUNTER — Encounter: Payer: Self-pay | Admitting: Internal Medicine

## 2015-07-07 VITALS — BP 134/80 | HR 57 | Temp 99.0°F | Resp 16 | Ht 60.0 in | Wt 145.8 lb

## 2015-07-07 DIAGNOSIS — F4321 Adjustment disorder with depressed mood: Secondary | ICD-10-CM

## 2015-07-07 DIAGNOSIS — K219 Gastro-esophageal reflux disease without esophagitis: Secondary | ICD-10-CM | POA: Diagnosis not present

## 2015-07-07 DIAGNOSIS — I10 Essential (primary) hypertension: Secondary | ICD-10-CM | POA: Diagnosis not present

## 2015-07-07 NOTE — Patient Instructions (Addendum)
We can have you stop taking the potassium pills as you likely do not need them. We will have you come do labs next time you come back.   It might be okay for you to stop taking the omeprazole pills. They are for heart burn or acid reflux. You can stop taking them and if you get any symptoms of that coming back start taking them again.

## 2015-07-07 NOTE — Progress Notes (Signed)
Pre visit review using our clinic review tool, if applicable. No additional management support is needed unless otherwise documented below in the visit note. 

## 2015-07-09 DIAGNOSIS — K219 Gastro-esophageal reflux disease without esophagitis: Secondary | ICD-10-CM | POA: Insufficient documentation

## 2015-07-09 NOTE — Assessment & Plan Note (Signed)
Will stop her potassium pills as she does have triamterene and ACE-I as well and last several potassium in the middle of range. Check labs at next visit. BP at goal on lisinopril/hctz and metoprolol.

## 2015-07-09 NOTE — Progress Notes (Signed)
   Subjective:    Patient ID: Katherine Espinoza, female    DOB: 1934/04/24, 81 y.o.   MRN: [id number]  HPI The patient is a 81 YO female coming in for trying to get her medication regimen down. She would like to stop some medicines if she can. She is having some nausea and she thinks it is from taking too many pills. Some mild constipation. No vomiting and has not bothered her eating. She has not lost any weight. Denies acid reflux or heart burn. She also has some concerns about a pneumonia shot that was given to her at our last visit which her insurance did not cover. They are trying to bill her 235 and she does not have that money.   Review of Systems  Constitutional: Negative for fever, activity change, appetite change, fatigue and unexpected weight change.  Respiratory: Negative for cough, chest tightness, shortness of breath and wheezing.   Cardiovascular: Negative for chest pain, palpitations and leg swelling.  Gastrointestinal: Positive for nausea. Negative for vomiting, abdominal pain, diarrhea, constipation, blood in stool and abdominal distention.  Musculoskeletal: Negative.   Skin: Negative.   Neurological: Negative.   Psychiatric/Behavioral: Positive for dysphoric mood. Negative for suicidal ideas, behavioral problems, confusion, sleep disturbance, self-injury and agitation. The patient is not nervous/anxious.       Objective:   Physical Exam  Constitutional: She is oriented to person, place, and time. She appears well-developed and well-nourished.  HENT:  Head: Normocephalic and atraumatic.  Eyes: EOM are normal.  Neck: Normal range of motion.  Cardiovascular: Normal rate and regular rhythm.   Pulmonary/Chest: Effort normal and breath sounds normal. No respiratory distress. She has no wheezes. She has no rales.  Abdominal: Soft. Bowel sounds are normal. She exhibits no distension. There is no tenderness. There is no rebound.  Musculoskeletal: She exhibits no edema.    Neurological: She is alert and oriented to person, place, and time. Coordination normal.  Skin: Skin is warm and dry.  Psychiatric:  Slightly distraught during our conversation.   Filed Vitals:   07/07/15 1602  BP: 134/80  Pulse: 57  Temp: 99 F (37.2 C)  TempSrc: Oral  Resp: 16  Height: 5' (1.524 m)  Weight: 145 lb 12.8 oz (66.134 kg)  SpO2: 98%      Assessment & Plan:

## 2015-07-09 NOTE — Assessment & Plan Note (Signed)
She denies ever having any GERD symptoms and she is taking omeprazole daily. Will stop and if she gets symptoms will resume to simplify regimen.

## 2015-07-09 NOTE — Assessment & Plan Note (Signed)
The lack of payment for needed vaccination is causing exacerbation of this condition. Will speak to our practice manager to see if there is anything that can be done to appeal to the insurance company about coverage for this.

## 2015-07-17 ENCOUNTER — Other Ambulatory Visit: Payer: Self-pay | Admitting: Internal Medicine

## 2015-07-17 DIAGNOSIS — Z1231 Encounter for screening mammogram for malignant neoplasm of breast: Secondary | ICD-10-CM

## 2015-07-22 ENCOUNTER — Ambulatory Visit
Admission: RE | Admit: 2015-07-22 | Discharge: 2015-07-22 | Disposition: A | Discharge status: Home / Self Care | Payer: Medicare PPO | Source: Ambulatory Visit | Attending: Internal Medicine | Admitting: Internal Medicine

## 2015-07-22 DIAGNOSIS — Z1231 Encounter for screening mammogram for malignant neoplasm of breast: Secondary | ICD-10-CM

## 2015-07-28 ENCOUNTER — Other Ambulatory Visit: Payer: Self-pay | Admitting: Internal Medicine

## 2015-09-17 ENCOUNTER — Encounter: Payer: Self-pay | Admitting: Internal Medicine

## 2015-09-17 ENCOUNTER — Ambulatory Visit (INDEPENDENT_AMBULATORY_CARE_PROVIDER_SITE_OTHER): Payer: Medicare PPO | Admitting: Internal Medicine

## 2015-09-17 ENCOUNTER — Other Ambulatory Visit (INDEPENDENT_AMBULATORY_CARE_PROVIDER_SITE_OTHER): Payer: Medicare PPO

## 2015-09-17 VITALS — BP 132/82 | HR 56 | Temp 98.7°F | Resp 12 | Ht 60.0 in | Wt 146.0 lb

## 2015-09-17 DIAGNOSIS — I10 Essential (primary) hypertension: Secondary | ICD-10-CM | POA: Diagnosis not present

## 2015-09-17 DIAGNOSIS — Z0000 Encounter for general adult medical examination without abnormal findings: Secondary | ICD-10-CM

## 2015-09-17 DIAGNOSIS — J4520 Mild intermittent asthma, uncomplicated: Secondary | ICD-10-CM | POA: Diagnosis not present

## 2015-09-17 DIAGNOSIS — E039 Hypothyroidism, unspecified: Secondary | ICD-10-CM

## 2015-09-17 LAB — LIPID PANEL
CHOL/HDL RATIO: 3
CHOLESTEROL: 201 mg/dL — AB (ref 0–200)
HDL: 63.7 mg/dL (ref >39.00)
LDL Cholesterol: 118 mg/dL — ABNORMAL HIGH (ref 0–99)
NonHDL: 137.28
TRIGLYCERIDES: 97 mg/dL (ref 0.0–149.0)
VLDL: 19.4 mg/dL (ref 0.0–40.0)

## 2015-09-17 LAB — TSH: TSH: 0.54 u[IU]/mL (ref 0.35–4.50)

## 2015-09-17 LAB — COMPREHENSIVE METABOLIC PANEL
ALT: 11 U/L (ref 0–35)
AST: 15 U/L (ref 0–37)
Albumin: 4 g/dL (ref 3.5–5.2)
Alkaline Phosphatase: 38 U/L — ABNORMAL LOW (ref 39–117)
BILIRUBIN TOTAL: 0.5 mg/dL (ref 0.2–1.2)
BUN: 17 mg/dL (ref 6–23)
CALCIUM: 8.8 mg/dL (ref 8.4–10.5)
CHLORIDE: 100 meq/L (ref 96–112)
CO2: 35 meq/L — AB (ref 19–32)
CREATININE: 0.76 mg/dL (ref 0.40–1.20)
GFR: 94.37 mL/min (ref >60.00)
Glucose, Bld: 93 mg/dL (ref 70–99)
Potassium: 3.7 mEq/L (ref 3.5–5.1)
SODIUM: 138 meq/L (ref 135–145)
Total Protein: 7.2 g/dL (ref 6.0–8.3)

## 2015-09-17 NOTE — Assessment & Plan Note (Signed)
Colonoscopy up to date and will age out before next screening due. Mammogram without findings. Pneumonia done, she declines flu shot today due to the outstanding bill over her pneumonia shot. Counseled her on home safety and mole surveillance. Given 10 year screening recommendations.

## 2015-09-17 NOTE — Assessment & Plan Note (Signed)
No flare, taking her advair and albuterol only prn. No flare in the last year.

## 2015-09-17 NOTE — Progress Notes (Signed)
Pre visit review using our clinic review tool, if applicable. No additional management support is needed unless otherwise documented below in the visit note. 

## 2015-09-17 NOTE — Patient Instructions (Addendum)
We are checking the labs today and call you back with the results.   Health Maintenance, Female Adopting a healthy lifestyle and getting preventive care can go a long way to promote health and wellness. Talk with your health care provider about what schedule of regular examinations is right for you. This is a good chance for you to check in with your provider about disease prevention and staying healthy. In between checkups, there are plenty of things you can do on your own. Experts have done a lot of research about which lifestyle changes and preventive measures are most likely to keep you healthy. Ask your health care provider for more information. WEIGHT AND DIET  Eat a healthy diet  Be sure to include plenty of vegetables, fruits, low-fat dairy products, and lean protein.  Do not eat a lot of foods high in solid fats, added sugars, or salt.  Get regular exercise. This is one of the most important things you can do for your health.  Most adults should exercise for at least 150 minutes each week. The exercise should increase your heart rate and make you sweat (moderate-intensity exercise).  Most adults should also do strengthening exercises at least twice a week. This is in addition to the moderate-intensity exercise.  Maintain a healthy weight  Body mass index (BMI) is a measurement that can be used to identify possible weight problems. It estimates body fat based on height and weight. Your health care provider can help determine your BMI and help you achieve or maintain a healthy weight.  For females 59 years of age and older:   A BMI below 18.5 is considered underweight.  A BMI of 18.5 to 24.9 is normal.  A BMI of 25 to 29.9 is considered overweight.  A BMI of 30 and above is considered obese.  Watch levels of cholesterol and blood lipids  You should start having your blood tested for lipids and cholesterol at 79 years of age, then have this test every 5 years.  You may need  to have your cholesterol levels checked more often if:  Your lipid or cholesterol levels are high.  You are older than 79 years of age.  You are at high risk for heart disease.  CANCER SCREENING   Lung Cancer  Lung cancer screening is recommended for adults 71-53 years old who are at high risk for lung cancer because of a history of smoking.  A yearly low-dose CT scan of the lungs is recommended for people who:  Currently smoke.  Have quit within the past 15 years.  Have at least a 30-pack-year history of smoking. A pack year is smoking an average of one pack of cigarettes a day for 1 year.  Yearly screening should continue until it has been 15 years since you quit.  Yearly screening should stop if you develop a health problem that would prevent you from having lung cancer treatment.  Breast Cancer  Practice breast self-awareness. This means understanding how your breasts normally appear and feel.  It also means doing regular breast self-exams. Let your health care provider know about any changes, no matter how small.  If you are in your 20s or 30s, you should have a clinical breast exam (CBE) by a health care provider every 1-3 years as part of a regular health exam.  If you are 14 or older, have a CBE every year. Also consider having a breast X-ray (mammogram) every year.  If you have a family history  of breast cancer, talk to your health care provider about genetic screening.  If you are at high risk for breast cancer, talk to your health care provider about having an MRI and a mammogram every year.  Breast cancer gene (BRCA) assessment is recommended for women who have family members with BRCA-related cancers. BRCA-related cancers include:  Breast.  Ovarian.  Tubal.  Peritoneal cancers.  Results of the assessment will determine the need for genetic counseling and BRCA1 and BRCA2 testing. Cervical Cancer Your health care provider may recommend that you be  screened regularly for cancer of the pelvic organs (ovaries, uterus, and vagina). This screening involves a pelvic examination, including checking for microscopic changes to the surface of your cervix (Pap test). You may be encouraged to have this screening done every 3 years, beginning at age 39.  For women ages 39-65, health care providers may recommend pelvic exams and Pap testing every 3 years, or they may recommend the Pap and pelvic exam, combined with testing for human papilloma virus (HPV), every 5 years. Some types of HPV increase your risk of cervical cancer. Testing for HPV may also be done on women of any age with unclear Pap test results.  Other health care providers may not recommend any screening for nonpregnant women who are considered low risk for pelvic cancer and who do not have symptoms. Ask your health care provider if a screening pelvic exam is right for you.  If you have had past treatment for cervical cancer or a condition that could lead to cancer, you need Pap tests and screening for cancer for at least 20 years after your treatment. If Pap tests have been discontinued, your risk factors (such as having a new sexual partner) need to be reassessed to determine if screening should resume. Some women have medical problems that increase the chance of getting cervical cancer. In these cases, your health care provider may recommend more frequent screening and Pap tests. Colorectal Cancer  This type of cancer can be detected and often prevented.  Routine colorectal cancer screening usually begins at 79 years of age and continues through 79 years of age.  Your health care provider may recommend screening at an earlier age if you have risk factors for colon cancer.  Your health care provider may also recommend using home test kits to check for hidden blood in the stool.  A small camera at the end of a tube can be used to examine your colon directly (sigmoidoscopy or colonoscopy).  This is done to check for the earliest forms of colorectal cancer.  Routine screening usually begins at age 70.  Direct examination of the colon should be repeated every 5-10 years through 79 years of age. However, you may need to be screened more often if early forms of precancerous polyps or small growths are found. Skin Cancer  Check your skin from head to toe regularly.  Tell your health care provider about any new moles or changes in moles, especially if there is a change in a mole's shape or color.  Also tell your health care provider if you have a mole that is larger than the size of a pencil eraser.  Always use sunscreen. Apply sunscreen liberally and repeatedly throughout the day.  Protect yourself by wearing long sleeves, pants, a wide-brimmed hat, and sunglasses whenever you are outside. HEART DISEASE, DIABETES, AND HIGH BLOOD PRESSURE   High blood pressure causes heart disease and increases the risk of stroke. High blood pressure is  more likely to develop in:  People who have blood pressure in the high end of the normal range (130-139/85-89 mm Hg).  People who are overweight or obese.  People who are African American.  If you are 11-85 years of age, have your blood pressure checked every 3-5 years. If you are 84 years of age or older, have your blood pressure checked every year. You should have your blood pressure measured twice--once when you are at a hospital or clinic, and once when you are not at a hospital or clinic. Record the average of the two measurements. To check your blood pressure when you are not at a hospital or clinic, you can use:  An automated blood pressure machine at a pharmacy.  A home blood pressure monitor.  If you are between 23 years and 12 years old, ask your health care provider if you should take aspirin to prevent strokes.  Have regular diabetes screenings. This involves taking a blood sample to check your fasting blood sugar level.  If you  are at a normal weight and have a low risk for diabetes, have this test once every three years after 79 years of age.  If you are overweight and have a high risk for diabetes, consider being tested at a younger age or more often. PREVENTING INFECTION  Hepatitis B  If you have a higher risk for hepatitis B, you should be screened for this virus. You are considered at high risk for hepatitis B if:  You were born in a country where hepatitis B is common. Ask your health care provider which countries are considered high risk.  Your parents were born in a high-risk country, and you have not been immunized against hepatitis B (hepatitis B vaccine).  You have HIV or AIDS.  You use needles to inject street drugs.  You live with someone who has hepatitis B.  You have had sex with someone who has hepatitis B.  You get hemodialysis treatment.  You take certain medicines for conditions, including cancer, organ transplantation, and autoimmune conditions. Hepatitis C  Blood testing is recommended for:  Everyone born from [birth year] through 1963.  Anyone with known risk factors for hepatitis C. Sexually transmitted infections (STIs)  You should be screened for sexually transmitted infections (STIs) including gonorrhea and chlamydia if:  You are sexually active and are younger than 79 years of age.  You are older than 79 years of age and your health care provider tells you that you are at risk for this type of infection.  Your sexual activity has changed since you were last screened and you are at an increased risk for chlamydia or gonorrhea. Ask your health care provider if you are at risk.  If you do not have HIV, but are at risk, it may be recommended that you take a prescription medicine daily to prevent HIV infection. This is called pre-exposure prophylaxis (PrEP). You are considered at risk if:  You are sexually active and do not regularly use condoms or know the HIV status of your  partner(s).  You take drugs by injection.  You are sexually active with a partner who has HIV. Talk with your health care provider about whether you are at high risk of being infected with HIV. If you choose to begin PrEP, you should first be tested for HIV. You should then be tested every 3 months for as long as you are taking PrEP.  PREGNANCY   If you are premenopausal and you  may become pregnant, ask your health care provider about preconception counseling.  If you may become pregnant, take 400 to 800 micrograms (mcg) of folic acid every day.  If you want to prevent pregnancy, talk to your health care provider about birth control (contraception). OSTEOPOROSIS AND MENOPAUSE   Osteoporosis is a disease in which the bones lose minerals and strength with aging. This can result in serious bone fractures. Your risk for osteoporosis can be identified using a bone density scan.  If you are 81 years of age or older, or if you are at risk for osteoporosis and fractures, ask your health care provider if you should be screened.  Ask your health care provider whether you should take a calcium or vitamin D supplement to lower your risk for osteoporosis.  Menopause may have certain physical symptoms and risks.  Hormone replacement therapy may reduce some of these symptoms and risks. Talk to your health care provider about whether hormone replacement therapy is right for you.  HOME CARE INSTRUCTIONS   Schedule regular health, dental, and eye exams.  Stay current with your immunizations.   Do not use any tobacco products including cigarettes, chewing tobacco, or electronic cigarettes.  If you are pregnant, do not drink alcohol.  If you are breastfeeding, limit how much and how often you drink alcohol.  Limit alcohol intake to no more than 1 drink per day for nonpregnant women. One drink equals 12 ounces of beer, 5 ounces of wine, or 1 ounces of hard liquor.  Do not use street drugs.  Do  not share needles.  Ask your health care provider for help if you need support or information about quitting drugs.  Tell your health care provider if you often feel depressed.  Tell your health care provider if you have ever been abused or do not feel safe at home.   This information is not intended to replace advice given to you by your health care provider. Make sure you discuss any questions you have with your health care provider.   Document Released: 07/19/2010 Document Revised: 01/24/2014 Document Reviewed: 12/05/2012 Elsevier Interactive Patient Education [employer].

## 2015-09-17 NOTE — Assessment & Plan Note (Signed)
BP at goal on her lisinopril and metoprolol and hctz. Checking CMP and adjust as needed.

## 2015-09-17 NOTE — Progress Notes (Signed)
   Subjective:    Patient ID: Katherine Espinoza, female    DOB: 1934-11-24, 81 y.o.   MRN: [id number]  HPI Here for medicare wellness and CPE, no new complaints. Please see A/P for status and treatment of chronic medical problems.   Diet: heart healthy Physical activity: sedentary Depression/mood screen: negative, some sadness over the recent loss of several of her children Hearing: intact to whispered voice Visual acuity: grossly normal, reading lens, performs annual eye exam  ADLs: capable Fall risk: none Home safety: good Cognitive evaluation: intact to orientation, naming, recall and repetition EOL planning: adv directives discussed  I have personally reviewed and have noted 1. The patient's medical and social history - reviewed today no changes 2. Their use of alcohol, tobacco or illicit drugs 3. Their current medications and supplements 4. The patient's functional ability including ADL's, fall risks, home safety risks and hearing or visual impairment. 5. Diet and physical activities 6. Evidence for depression or mood disorders 7. Care team reviewed and updated (available in snapshot)  Review of Systems  Constitutional: Negative for activity change, appetite change, fatigue, fever and unexpected weight change.  HENT: Negative.   Eyes: Negative.   Respiratory: Negative for cough, chest tightness, shortness of breath and wheezing.   Cardiovascular: Negative for chest pain, palpitations and leg swelling.  Gastrointestinal: Negative for abdominal distention, abdominal pain, blood in stool, constipation, diarrhea, nausea and vomiting.  Musculoskeletal: Negative.   Skin: Negative.   Neurological: Negative.   Psychiatric/Behavioral: Negative for agitation, behavioral problems, confusion, dysphoric mood, self-injury, sleep disturbance and suicidal ideas. The patient is not nervous/anxious.       Objective:   Physical Exam  Constitutional: She is oriented to person, place, and  time. She appears well-developed and well-nourished.  HENT:  Head: Normocephalic and atraumatic.  Eyes: EOM are normal.  Neck: Normal range of motion.  Cardiovascular: Normal rate and regular rhythm.   Pulmonary/Chest: Effort normal and breath sounds normal. No respiratory distress. She has no wheezes. She has no rales.  Abdominal: Soft. Bowel sounds are normal. She exhibits no distension. There is no tenderness. There is no rebound.  Musculoskeletal: She exhibits no edema.  Neurological: She is alert and oriented to person, place, and time. Coordination normal.  Skin: Skin is warm and dry.   Vitals:   09/17/15 1050  BP: 132/82  Pulse: (!) 56  Resp: 12  Temp: 98.7 F (37.1 C)  TempSrc: Oral  SpO2: 97%  Weight: 146 lb (66.2 kg)  Height: 5' (1.524 m)      Assessment & Plan:

## 2015-09-17 NOTE — Assessment & Plan Note (Signed)
Checking TSH and adjust as needed her synthroid 50 mcg daily.  

## 2015-11-03 ENCOUNTER — Other Ambulatory Visit: Payer: Self-pay | Admitting: *Deleted

## 2015-11-03 MED ORDER — TRIAMTERENE-HCTZ 37.5-25 MG PO CAPS
1.0000 | ORAL_CAPSULE | Freq: Every day | ORAL | 3 refills | Status: DC
Start: 2015-11-03 — End: 2016-08-31

## 2015-11-03 MED ORDER — METOPROLOL TARTRATE 25 MG PO TABS: 25.0000 mg | ORAL_TABLET | Freq: Two times a day (BID) | ORAL | 3 refills | Status: DC

## 2015-11-03 NOTE — Telephone Encounter (Signed)
Rec'd call pt states [employer] sent request to have her Metoprolol and Triamterene refill, but they stated they have not heard back from office. She is needing meds. Inform pt per chart no request came electronically but will send to [facility]...Raechel Chute/lmb

## 2016-01-21 ENCOUNTER — Other Ambulatory Visit: Payer: Self-pay | Admitting: Internal Medicine

## 2016-02-05 ENCOUNTER — Telehealth: Payer: Self-pay

## 2016-02-05 MED ORDER — GABAPENTIN 100 MG PO CAPS: 100.0000 mg | ORAL_CAPSULE | Freq: Every day | ORAL | 1 refills | Status: DC

## 2016-02-05 NOTE — Telephone Encounter (Signed)
[facility]

## 2016-02-09 ENCOUNTER — Telehealth: Payer: Self-pay

## 2016-02-09 MED ORDER — LEVOTHYROXINE SODIUM 50 MCG PO TABS: 50.0000 ug | ORAL_TABLET | Freq: Every day | ORAL | 1 refills | Status: DC

## 2016-02-09 MED ORDER — LISINOPRIL 40 MG PO TABS
40.0000 mg | ORAL_TABLET | Freq: Every day | ORAL | 1 refills | Status: DC
Start: 2016-02-09 — End: 2016-06-28

## 2016-02-09 NOTE — Telephone Encounter (Signed)
Med refill for levothyroxine, and lisinopril for 90 day supply

## 2016-02-11 ENCOUNTER — Other Ambulatory Visit: Payer: Self-pay | Admitting: General Practice

## 2016-02-29 ENCOUNTER — Telehealth: Payer: Self-pay | Admitting: *Deleted

## 2016-02-29 NOTE — Telephone Encounter (Signed)
She is on low dose and can just stop taking if she is okay.

## 2016-02-29 NOTE — Telephone Encounter (Signed)
Pt left msg on triage stating she has been taking citalopram for depression, and she feels that shr is ready to come off med. She is doing a lot better, and has moved to [place] w/her daughter. Wanting to know if its ok to stop taking, or does she need to wean her self off.[name].Raechel Chute/lmb

## 2016-02-29 NOTE — Telephone Encounter (Signed)
Notified pt w/MD response.../lmb 

## 2016-04-18 ENCOUNTER — Other Ambulatory Visit: Payer: Self-pay | Admitting: *Deleted

## 2016-04-18 MED ORDER — GABAPENTIN 100 MG PO CAPS: 100.0000 mg | ORAL_CAPSULE | Freq: Every day | ORAL | 1 refills | Status: DC

## 2016-06-28 ENCOUNTER — Telehealth: Payer: Self-pay | Admitting: Internal Medicine

## 2016-06-28 MED ORDER — OMEPRAZOLE 20 MG PO CPDR: 20.0000 mg | DELAYED_RELEASE_CAPSULE | Freq: Every day | ORAL | 0 refills | Status: AC

## 2016-06-28 MED ORDER — LISINOPRIL 40 MG PO TABS: 40.0000 mg | ORAL_TABLET | Freq: Every day | ORAL | 0 refills | Status: DC

## 2016-06-28 MED ORDER — LEVOTHYROXINE SODIUM 50 MCG PO TABS: 50.0000 ug | ORAL_TABLET | Freq: Every day | ORAL | 0 refills | Status: DC

## 2016-06-28 NOTE — Telephone Encounter (Signed)
Patient is requesting refills on levothyroxine, omeprazole and lisinopril.

## 2016-06-28 NOTE — Telephone Encounter (Signed)
sent 

## 2016-08-23 ENCOUNTER — Other Ambulatory Visit (INDEPENDENT_AMBULATORY_CARE_PROVIDER_SITE_OTHER): Payer: Medicare PPO

## 2016-08-23 ENCOUNTER — Ambulatory Visit (INDEPENDENT_AMBULATORY_CARE_PROVIDER_SITE_OTHER): Payer: Medicare PPO | Admitting: Nurse Practitioner

## 2016-08-23 ENCOUNTER — Encounter: Payer: Self-pay | Admitting: Nurse Practitioner

## 2016-08-23 VITALS — BP 170/92 | HR 54 | Temp 99.1°F | Ht 60.0 in | Wt 149.0 lb

## 2016-08-23 DIAGNOSIS — F4323 Adjustment disorder with mixed anxiety and depressed mood: Secondary | ICD-10-CM | POA: Diagnosis not present

## 2016-08-23 DIAGNOSIS — I10 Essential (primary) hypertension: Secondary | ICD-10-CM | POA: Diagnosis not present

## 2016-08-23 DIAGNOSIS — E039 Hypothyroidism, unspecified: Secondary | ICD-10-CM | POA: Diagnosis not present

## 2016-08-23 LAB — TSH: TSH: 0.33 u[IU]/mL — AB (ref 0.35–4.50)

## 2016-08-23 LAB — T4, FREE: Free T4: 1.13 ng/dL (ref 0.60–1.60)

## 2016-08-23 LAB — BASIC METABOLIC PANEL
BUN: 11 mg/dL (ref 6–23)
CHLORIDE: 98 meq/L (ref 96–112)
CO2: 32 mEq/L (ref 19–32)
Calcium: 9.4 mg/dL (ref 8.4–10.5)
Creatinine, Ser: 0.71 mg/dL (ref 0.40–1.20)
GFR: 101.83 mL/min (ref >60.00)
Glucose, Bld: 108 mg/dL — ABNORMAL HIGH (ref 70–99)
POTASSIUM: 3.5 meq/L (ref 3.5–5.1)
SODIUM: 138 meq/L (ref 135–145)

## 2016-08-23 MED ORDER — CITALOPRAM HYDROBROMIDE 10 MG PO TABS: 10.0000 mg | ORAL_TABLET | Freq: Every day | ORAL | 0 refills | Status: AC
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
## 2016-08-23 NOTE — Patient Instructions (Signed)
I instructed pt to start 1/2 tablet once daily for 1 week and then increase to a full tablet once daily on week two as tolerated.   We discussed common side effects such as nausea, drowsiness and weight gain.  Also discussed rare but serious side effect of suicide ideation.  She is instructed to discontinue medication go directly to ED if this occurs.  Pt verbalizes understanding.   Plan follow up in 1 month to evaluate progress.    Go to lab for blood draw.  Follow up with new pcp 10/17/2016 as scheduled.

## 2016-08-24 ENCOUNTER — Telehealth: Payer: Self-pay | Admitting: Nurse Practitioner

## 2016-08-24 NOTE — Telephone Encounter (Signed)
Spoke with pt, she doesn't have the pill cutter and the pill is very tiny. Pt was wondering if she can go a head and take the whole pill. Please advise?

## 2016-08-24 NOTE — Telephone Encounter (Signed)
Pt called and said that she was told to take 1/2 tablet of her 10mg  Celexa that she had at home. She said that the pills are too small to try to break or cut in half. What should she do?

## 2016-08-27 NOTE — Telephone Encounter (Signed)
Please have patient maintain current novolog and NPH as prescribed. Enter urgent referral to endocrinology

## 2016-08-29 NOTE — Telephone Encounter (Signed)
Pt is aware.  

## 2016-08-29 NOTE — Telephone Encounter (Signed)
PLEASE TITRATE MEDICATION UP AS INSTRUCTED TO MINIMIZE RISK OF SIDE EFFECTS.

## 2016-08-29 NOTE — Telephone Encounter (Signed)
Pt report taking 1 tab for 3 days, no sign of side effect beside sleepy (alot going on with the family).   Pt request to stay with 1 tab daily--easy to keep up with for her. Is this ok?

## 2016-08-29 NOTE — Telephone Encounter (Signed)
ok 

## 2016-08-29 NOTE — Telephone Encounter (Signed)
Please advise 

## 2016-08-31 MED ORDER — LEVOTHYROXINE SODIUM 50 MCG PO TABS: 50.0000 ug | ORAL_TABLET | Freq: Every day | ORAL | 1 refills | Status: AC

## 2016-08-31 MED ORDER — METOPROLOL TARTRATE 25 MG PO TABS: 25.0000 mg | ORAL_TABLET | Freq: Two times a day (BID) | ORAL | 1 refills | Status: AC

## 2016-08-31 MED ORDER — LISINOPRIL 40 MG PO TABS: 40.0000 mg | ORAL_TABLET | Freq: Every day | ORAL | 1 refills | Status: AC

## 2016-08-31 MED ORDER — TRIAMTERENE-HCTZ 37.5-25 MG PO CAPS: 1.0000 | ORAL_CAPSULE | Freq: Every day | ORAL | 1 refills | Status: AC

## 2016-09-30 ENCOUNTER — Other Ambulatory Visit: Payer: Self-pay | Admitting: Family

## 2016-09-30 ENCOUNTER — Other Ambulatory Visit: Payer: Self-pay | Admitting: Internal Medicine

## 2016-09-30 DIAGNOSIS — I10 Essential (primary) hypertension: Secondary | ICD-10-CM

## 2016-09-30 DIAGNOSIS — E039 Hypothyroidism, unspecified: Secondary | ICD-10-CM

## 2016-10-05 ENCOUNTER — Telehealth: Payer: Self-pay | Admitting: Internal Medicine

## 2016-10-05 NOTE — Telephone Encounter (Signed)
This is a patient of Dr Frutoso Chase who was seen by [facility] on 08/23/2016. She has since received notice that her insurance did not pay for this visit. Can you check on this or what can we do to fix it. She said that this has not happened when she has seen Dr Okey Dupre.

## 2016-10-17 NOTE — Telephone Encounter (Signed)
Insurance shows the balance is from the patient's deductible. I am concerned this might be due to the provider, sent email to billing to confirm.

## 2016-11-04 NOTE — Telephone Encounter (Signed)
LM letting pt know °

## 2016-11-04 NOTE — Telephone Encounter (Signed)
Please let our patient know that we have corrected [employer]'s error and she has a zero balance.

## 2017-04-14 ENCOUNTER — Other Ambulatory Visit: Payer: Self-pay | Admitting: Internal Medicine

## 2017-10-20 ENCOUNTER — Other Ambulatory Visit: Payer: Self-pay | Admitting: Internal Medicine
